# Patient Record
Sex: Female | Born: 1985 | Race: White | Hispanic: No | Marital: Single | Smoking: Current every day smoker
Health system: Southern US, Community
[De-identification: ages and names within clinical notes are randomized; demographics above are authoritative.]

## PROBLEM LIST (undated history)

## (undated) DIAGNOSIS — F1010 Alcohol abuse, uncomplicated: Secondary | ICD-10-CM

## (undated) DIAGNOSIS — R569 Unspecified convulsions: Secondary | ICD-10-CM

## (undated) DIAGNOSIS — G709 Myoneural disorder, unspecified: Secondary | ICD-10-CM

## (undated) DIAGNOSIS — K219 Gastro-esophageal reflux disease without esophagitis: Secondary | ICD-10-CM

## (undated) DIAGNOSIS — F1910 Other psychoactive substance abuse, uncomplicated: Secondary | ICD-10-CM

## (undated) HISTORY — PX: ANKLE FRACTURE SURGERY: SHX122

---

## 2010-07-17 HISTORY — PX: WRIST SURGERY: SHX841

## 2012-04-01 ENCOUNTER — Encounter (HOSPITAL_BASED_OUTPATIENT_CLINIC_OR_DEPARTMENT_OTHER): Payer: Self-pay | Admitting: *Deleted

## 2012-04-01 ENCOUNTER — Emergency Department (HOSPITAL_BASED_OUTPATIENT_CLINIC_OR_DEPARTMENT_OTHER)
Admission: EM | Admit: 2012-04-01 | Discharge: 2012-04-01 | Disposition: A | Discharge status: Home / Self Care | Payer: Medicaid Other | Attending: Emergency Medicine | Admitting: Emergency Medicine

## 2012-04-01 DIAGNOSIS — K0889 Other specified disorders of teeth and supporting structures: Secondary | ICD-10-CM

## 2012-04-01 DIAGNOSIS — K089 Disorder of teeth and supporting structures, unspecified: Secondary | ICD-10-CM | POA: Insufficient documentation

## 2012-04-01 DIAGNOSIS — F17200 Nicotine dependence, unspecified, uncomplicated: Secondary | ICD-10-CM | POA: Insufficient documentation

## 2012-04-01 DIAGNOSIS — Z888 Allergy status to other drugs, medicaments and biological substances status: Secondary | ICD-10-CM | POA: Insufficient documentation

## 2012-04-01 MED ORDER — OXYCODONE-ACETAMINOPHEN 5-325 MG PO TABS: ORAL_TABLET | ORAL | Status: DC

## 2012-04-01 MED ORDER — PENICILLIN V POTASSIUM 500 MG PO TABS: 500.0000 mg | ORAL_TABLET | Freq: Four times a day (QID) | ORAL | Status: AC

## 2012-04-01 MED ORDER — OXYCODONE-ACETAMINOPHEN 5-325 MG PO TABS
2.0000 | ORAL_TABLET | Freq: Once | ORAL | Status: AC
  Administered 2012-04-01: 2 via ORAL
  Filled 2012-04-01 (×2): qty 2

## 2012-04-01 NOTE — ED Notes (Signed)
Toothache x 3 days

## 2012-04-01 NOTE — ED Provider Notes (Signed)
History     CSN: [id number]  Arrival date & time 04/01/12  1200   First MD Initiated Contact with Patient 04/01/12 1307      Chief Complaint  Patient presents with  . Dental Problem    (Consider location/radiation/quality/duration/timing/severity/associated sxs/prior treatment) HPI  26 y.o. female in no acute distress complaining of pain to lower left jaw 3 days. Patient denies any fever, nausea vomiting. Does say she was instructed by her dentist to come to the emergency room for antibiotics. Pain is 10 out of 10 nonradiating, exacerbated by movement and showing.  History reviewed. No pertinent past medical history.  History reviewed. No pertinent past surgical history.  No family history on file.  History  Substance Use Topics  . Smoking status: Current Every Day Smoker -- 0.5 packs/day  . Smokeless tobacco: Not on file  . Alcohol Use: No    OB History    Grav Para Term Preterm Abortions TAB SAB Ect Mult Living                  Review of Systems  Constitutional: Negative for fever.  HENT: Positive for dental problem.   Respiratory: Negative for shortness of breath.   Cardiovascular: Negative for chest pain.  Gastrointestinal: Negative for nausea, vomiting, abdominal pain and diarrhea.  All other systems reviewed and are negative.    Allergies  Ultram  Home Medications   Current Outpatient Rx  Name Route Sig Dispense Refill  . OXYCODONE-ACETAMINOPHEN 5-325 MG PO TABS  1 to 2 tabs PO q6hrs  PRN for pain 10 tablet 0  . PENICILLIN V POTASSIUM 500 MG PO TABS Oral Take 1 tablet (500 mg total) by mouth 4 (four) times daily. 40 tablet 0    BP 120/56  Pulse 85  Temp 98.1 F (36.7 C) (Oral)  Resp 20  SpO2 100%  Physical Exam  Nursing note and vitals reviewed. Constitutional: She is oriented to person, place, and time. She appears well-developed and well-nourished. No distress.  HENT:  Head: Normocephalic.  Mouth/Throat: Oropharynx is clear and moist.      Dental caries, no signs of abscess  Eyes: Conjunctivae normal and EOM are normal.  Cardiovascular: Normal rate.   Pulmonary/Chest: Effort normal. No stridor.  Musculoskeletal: Normal range of motion.  Lymphadenopathy:    She has no cervical adenopathy.  Neurological: She is alert and oriented to person, place, and time.  Psychiatric: She has a normal mood and affect.    ED Course  Procedures (including critical care time)  Labs Reviewed - No data to display No results found.   1. Pain, dental       MDM  Patient states she has a dentist that she can followup with. I will give her pain control and potassium VK for infection prophylaxis. Pt verbalized understanding and agrees with care plan. Outpatient follow-up and return precautions given.           Wynetta Emery, PA-C 04/01/12 1545

## 2012-04-02 NOTE — ED Provider Notes (Signed)
Medical screening examination/treatment/procedure(s) were performed by non-physician practitioner and as supervising physician I was immediately available for consultation/collaboration.  Geoffery Lyons, MD 04/02/12 [phone]

## 2012-04-04 ENCOUNTER — Encounter (HOSPITAL_COMMUNITY): Payer: Self-pay | Admitting: Emergency Medicine

## 2012-04-04 ENCOUNTER — Emergency Department (HOSPITAL_COMMUNITY)
Admission: EM | Admit: 2012-04-04 | Discharge: 2012-04-04 | Disposition: A | Discharge status: Home / Self Care | Payer: Medicaid Other | Attending: Emergency Medicine | Admitting: Emergency Medicine

## 2012-04-04 DIAGNOSIS — R6884 Jaw pain: Secondary | ICD-10-CM | POA: Insufficient documentation

## 2012-04-04 DIAGNOSIS — K089 Disorder of teeth and supporting structures, unspecified: Secondary | ICD-10-CM | POA: Insufficient documentation

## 2012-04-04 DIAGNOSIS — G8929 Other chronic pain: Secondary | ICD-10-CM

## 2012-04-04 MED ORDER — BUPIVACAINE HCL (PF) 0.25 % IJ SOLN: 5.0000 mL | Freq: Once | INTRAMUSCULAR | Status: DC

## 2012-04-04 MED ORDER — PENICILLIN V POTASSIUM 500 MG PO TABS: 1000.0000 mg | ORAL_TABLET | Freq: Two times a day (BID) | ORAL | Status: DC

## 2012-04-04 MED ORDER — BUPIVACAINE-EPINEPHRINE PF 0.5-1:200000 % IJ SOLN
INTRAMUSCULAR | Status: AC
  Administered 2012-04-04: 14:00:00
  Filled 2012-04-04: qty 1.8

## 2012-04-04 MED ORDER — OXYCODONE-ACETAMINOPHEN 5-325 MG PO TABS: ORAL_TABLET | ORAL | Status: DC

## 2012-04-04 NOTE — ED Provider Notes (Signed)
Medical screening examination/treatment/procedure(s) were performed by non-physician practitioner and as supervising physician I was immediately available for consultation/collaboration.   Gwyneth Sprout, MD 04/04/12 [phone]

## 2012-04-04 NOTE — ED Provider Notes (Signed)
History     CSN: [id number]  Arrival date & time 04/04/12  1206   First MD Initiated Contact with Patient 04/04/12 1301      Chief Complaint  Patient presents with  . Dental Pain    l/lower jaw pain x 2 days    (Consider location/radiation/quality/duration/timing/severity/associated sxs/prior treatment) HPI  26 y.o. female-year-old female in no acute distress complaining of exacerbation of chronic dental pain to left lower jaw. Patient denies any fever, nausea vomiting, change in vision. Patient states that her dentist (Dr. Clarene Duke) sent her to the emergency room.   History reviewed. No pertinent past medical history.  Past Surgical History  Procedure Date  . Ankle fracture surgery     History reviewed. No pertinent family history.  History  Substance Use Topics  . Smoking status: Current Every Day Smoker    Types: Cigarettes  . Smokeless tobacco: Not on file  . Alcohol Use: No    OB History    Grav Para Term Preterm Abortions TAB SAB Ect Mult Living                  Review of Systems  Constitutional: Negative for fever.  HENT: Positive for dental problem.   Respiratory: Negative for shortness of breath.   Cardiovascular: Negative for chest pain.  Gastrointestinal: Negative for nausea, vomiting, abdominal pain and diarrhea.  All other systems reviewed and are negative.    Allergies  Tramadol  Home Medications   Current Outpatient Rx  Name Route Sig Dispense Refill  . ACETAMINOPHEN 325 MG PO TABS Oral Take 650 mg by mouth every 6 (six) hours as needed. Pain    . OXYCODONE-ACETAMINOPHEN 5-325 MG PO TABS  1 to 2 tabs PO q6hrs  PRN for pain 15 tablet 0  . PENICILLIN V POTASSIUM 500 MG PO TABS Oral Take 2 tablets (1,000 mg total) by mouth 2 (two) times daily. X 7 days 28 tablet 0    BP 123/71  Pulse 89  Temp 98.1 F (36.7 C) (Oral)  Resp 18  Wt 145 lb (65.772 kg)  SpO2 100%  LMP 03/15/2012  Physical Exam  Nursing note and vitals  reviewed. Constitutional: She is oriented to person, place, and time. She appears well-developed and well-nourished. No distress.  HENT:  Head: Normocephalic and atraumatic.  Right Ear: External ear normal.       Multiple dental caries  Eyes: Conjunctivae normal and EOM are normal. Pupils are equal, round, and reactive to light.  Neck: Normal range of motion.  Cardiovascular: Normal rate.   Pulmonary/Chest: Effort normal and breath sounds normal. No stridor.  Abdominal: Soft. Bowel sounds are normal.  Musculoskeletal: Normal range of motion.  Neurological: She is alert and oriented to person, place, and time.  Psychiatric: She has a normal mood and affect.    ED Course  Procedures (including critical care time)  Labs Reviewed - No data to display No results found.   1. Chronic dental pain       MDM  26 y.o. female with chronic dental pain given dental block with significant relief. Patient has a dentist Dr. Clarene Duke who she will follow with.   New Prescriptions   OXYCODONE-ACETAMINOPHEN (PERCOCET/ROXICET) 5-325 MG PER TABLET    1 to 2 tabs PO q6hrs  PRN for pain   PENICILLIN V POTASSIUM (VEETID) 500 MG TABLET    Take 2 tablets (1,000 mg total) by mouth 2 (two) times daily. X 7 days  Wynetta Emery, PA-C 04/04/12 1415

## 2012-04-04 NOTE — ED Notes (Signed)
Pt reports l/lower jaw pain x 2 days. Instructed by dentist to be evaluated in ED for pain and obtain antibiotics

## 2012-04-04 NOTE — Progress Notes (Signed)
wl ed cm noted pt without pcp self pay with tooth pain CM provided pt information on free dental clinic on 04/12/12 & 04/13/12 at [employer].  Pt appreciative of resources Fast track rn updated Provided pt with list of accepting self pay providers, financial and medication resources for [employer]

## 2012-04-05 ENCOUNTER — Encounter (HOSPITAL_BASED_OUTPATIENT_CLINIC_OR_DEPARTMENT_OTHER): Payer: Self-pay | Admitting: *Deleted

## 2012-04-26 ENCOUNTER — Encounter (HOSPITAL_COMMUNITY): Payer: Self-pay | Admitting: *Deleted

## 2012-04-26 ENCOUNTER — Emergency Department (HOSPITAL_COMMUNITY)
Admission: EM | Admit: 2012-04-26 | Discharge: 2012-04-26 | Disposition: A | Discharge status: Home / Self Care | Payer: Self-pay | Attending: Emergency Medicine | Admitting: Emergency Medicine

## 2012-04-26 DIAGNOSIS — K089 Disorder of teeth and supporting structures, unspecified: Secondary | ICD-10-CM | POA: Insufficient documentation

## 2012-04-26 DIAGNOSIS — K0889 Other specified disorders of teeth and supporting structures: Secondary | ICD-10-CM

## 2012-04-26 NOTE — ED Notes (Signed)
Pt continues to wait to be evaluated by ED provider. 

## 2012-04-26 NOTE — ED Notes (Signed)
Pt resting quietly in room at this time.  Continues to wait to be evaluated by ED provider.

## 2012-04-26 NOTE — ED Provider Notes (Signed)
Karen Cline is a 26 y.o. female walk to the dentist to state that she did not want to wait any longer. She has mild swelling right jaw region. She is ambulating easily. Her vital signs are normal. She was encouraged to stay, but decided to leave.  Flint Melter, MD 04/26/12 [phone]

## 2012-04-26 NOTE — ED Notes (Signed)
Toothache since yesterday 

## 2012-04-28 ENCOUNTER — Emergency Department (HOSPITAL_BASED_OUTPATIENT_CLINIC_OR_DEPARTMENT_OTHER)
Admission: EM | Admit: 2012-04-28 | Discharge: 2012-04-28 | Disposition: A | Discharge status: Home / Self Care | Payer: Medicaid Other | Attending: Emergency Medicine | Admitting: Emergency Medicine

## 2012-04-28 ENCOUNTER — Encounter (HOSPITAL_BASED_OUTPATIENT_CLINIC_OR_DEPARTMENT_OTHER): Payer: Self-pay | Admitting: *Deleted

## 2012-04-28 DIAGNOSIS — K089 Disorder of teeth and supporting structures, unspecified: Secondary | ICD-10-CM | POA: Insufficient documentation

## 2012-04-28 DIAGNOSIS — F17200 Nicotine dependence, unspecified, uncomplicated: Secondary | ICD-10-CM | POA: Insufficient documentation

## 2012-04-28 DIAGNOSIS — K0889 Other specified disorders of teeth and supporting structures: Secondary | ICD-10-CM

## 2012-04-28 MED ORDER — ETODOLAC 500 MG PO TABS: 500.0000 mg | ORAL_TABLET | Freq: Two times a day (BID) | ORAL | Status: DC

## 2012-04-28 MED ORDER — PENICILLIN V POTASSIUM 500 MG PO TABS: 500.0000 mg | ORAL_TABLET | Freq: Three times a day (TID) | ORAL | Status: DC

## 2012-04-28 MED ORDER — OXYCODONE-ACETAMINOPHEN 5-325 MG PO TABS
1.0000 | ORAL_TABLET | Freq: Once | ORAL | Status: AC
  Administered 2012-04-28: 1 via ORAL
  Filled 2012-04-28 (×2): qty 1
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
## 2012-04-28 NOTE — ED Provider Notes (Signed)
History     CSN: [id number]  Arrival date & time 04/28/12  1305   First MD Initiated Contact with Patient 04/28/12 1409      Chief Complaint  Patient presents with  . Dental Pain   Patient is a 26 y.o. female presenting with tooth pain. The history is provided by the patient.  Dental PainThe symptoms began 3 to 5 days ago. The symptoms are unchanged. The symptoms are recurrent. The symptoms occur constantly.  Additional symptoms include: dental sensitivity to temperature, gum tenderness and jaw pain. Additional symptoms do not include: gum swelling, purulent gums, trismus, trouble swallowing, drooling and ear pain.   the patient states she saw her dentist but was told she needs to be on antibiotics patient states her dentist did not prescribe any for her.  She has plans to followup with her dentist.  Patient was seen in emergency department last month for dental pain however the patient states that was a different issue  History reviewed. No pertinent past medical history.  History reviewed. No pertinent past surgical history.  History reviewed. No pertinent family history.  History  Substance Use Topics  . Smoking status: Current Every Day Smoker -- 0.5 packs/day    Types: Cigarettes  . Smokeless tobacco: Not on file  . Alcohol Use: No    OB History    Grav Para Term Preterm Abortions TAB SAB Ect Mult Living                  Review of Systems  HENT: Negative for ear pain, drooling and trouble swallowing.   All other systems reviewed and are negative.    Allergies  Tramadol and Ultram  Home Medications   Current Outpatient Rx  Name Route Sig Dispense Refill  . ETODOLAC 500 MG PO TABS Oral Take 1 tablet (500 mg total) by mouth 2 (two) times daily. 20 tablet 0  . PENICILLIN V POTASSIUM 500 MG PO TABS Oral Take 1 tablet (500 mg total) by mouth 3 (three) times daily. 30 tablet 0    BP 112/73  Pulse 94  Temp 98.7 F (37.1 C) (Oral)  Resp 18  Ht 5\' 4"  (1.626 m)   Wt 138 lb (62.596 kg)  BMI 23.69 kg/m2  SpO2 100%  LMP 04/21/2012  Physical Exam  Nursing note and vitals reviewed. Constitutional: She appears well-developed and well-nourished. No distress.  HENT:  Head: Normocephalic and atraumatic.  Right Ear: External ear normal.  Left Ear: External ear normal.       No jaw swelling noted, patient does have tenderness in the left lower mandibular area on one of her molars  Eyes: Conjunctivae normal are normal. Right eye exhibits no discharge. Left eye exhibits no discharge. No scleral icterus.  Neck: Neck supple. No tracheal deviation present.  Cardiovascular: Normal rate.   Pulmonary/Chest: Effort normal. No stridor. No respiratory distress.  Musculoskeletal: She exhibits no edema.  Neurological: She is alert. Cranial nerve deficit: no gross deficits.  Skin: Skin is warm and dry. No rash noted.  Psychiatric: She has a normal mood and affect.    ED Course  Procedures (including critical care time)  Labs Reviewed - No data to display No results found.   1. Pain, dental       MDM  Patient was given a dose of Percocet here in the emergency department. I prescribed Lodine and penicillin. I encouraged followup with her dentist. there is no evidence of any obvious abscess  At this  time there does not appear to be any evidence of an acute emergency medical condition and the patient appears stable for discharge with appropriate outpatient follow up.         Celene Kras, MD 04/28/12 [phone]

## 2012-06-17 ENCOUNTER — Encounter (HOSPITAL_COMMUNITY): Payer: Self-pay | Admitting: Emergency Medicine

## 2012-10-04 ENCOUNTER — Encounter (HOSPITAL_COMMUNITY): Payer: Self-pay | Admitting: Adult Health

## 2012-10-04 ENCOUNTER — Emergency Department (HOSPITAL_COMMUNITY)
Admission: EM | Admit: 2012-10-04 | Discharge: 2012-10-05 | Disposition: A | Discharge status: Home / Self Care | Payer: Medicaid Other | Attending: Emergency Medicine | Admitting: Emergency Medicine

## 2012-10-04 DIAGNOSIS — Z3202 Encounter for pregnancy test, result negative: Secondary | ICD-10-CM | POA: Insufficient documentation

## 2012-10-04 DIAGNOSIS — R569 Unspecified convulsions: Secondary | ICD-10-CM | POA: Insufficient documentation

## 2012-10-04 DIAGNOSIS — F1910 Other psychoactive substance abuse, uncomplicated: Secondary | ICD-10-CM | POA: Insufficient documentation

## 2012-10-04 DIAGNOSIS — F17200 Nicotine dependence, unspecified, uncomplicated: Secondary | ICD-10-CM | POA: Insufficient documentation

## 2012-10-04 HISTORY — DX: Other psychoactive substance abuse, uncomplicated: F19.10

## 2012-10-04 HISTORY — DX: Alcohol abuse, uncomplicated: F10.10

## 2012-10-04 LAB — CBC
Platelets: 254 10*3/uL (ref 150–400)
RDW: 14 % (ref 11.5–15.5)
WBC: 5 10*3/uL (ref 4.0–10.5)

## 2012-10-04 LAB — RAPID URINE DRUG SCREEN, HOSP PERFORMED
Amphetamines: NOT DETECTED
Barbiturates: NOT DETECTED
Benzodiazepines: POSITIVE — AB
Cocaine: POSITIVE — AB

## 2012-10-04 LAB — COMPREHENSIVE METABOLIC PANEL
AST: 16 U/L (ref 0–37)
Albumin: 3.8 g/dL (ref 3.5–5.2)
Alkaline Phosphatase: 87 U/L (ref 39–117)
Chloride: 102 mEq/L (ref 96–112)
Potassium: 3.7 mEq/L (ref 3.5–5.1)
Total Bilirubin: 0.4 mg/dL (ref 0.3–1.2)
Total Protein: 6.9 g/dL (ref 6.0–8.3)

## 2012-10-04 LAB — SALICYLATE LEVEL: Salicylate Lvl: <2 mg/dL — ABNORMAL LOW (ref 2.8–20.0)

## 2012-10-04 LAB — POCT PREGNANCY, URINE: Preg Test, Ur: NEGATIVE

## 2012-10-04 LAB — ACETAMINOPHEN LEVEL: Acetaminophen (Tylenol), Serum: <15 ug/mL (ref 10–30)

## 2012-10-04 MED ORDER — ACETAMINOPHEN 325 MG PO TABS
650.0000 mg | ORAL_TABLET | ORAL | Status: DC | PRN
  Administered 2012-10-04: 650 mg via ORAL
  Filled 2012-10-04: qty 2

## 2012-10-04 MED ORDER — ONDANSETRON HCL 8 MG PO TABS
4.0000 mg | ORAL_TABLET | Freq: Three times a day (TID) | ORAL | Status: DC | PRN
  Administered 2012-10-05: 4 mg via ORAL
  Filled 2012-10-04: qty 1

## 2012-10-04 MED ORDER — NICOTINE 21 MG/24HR TD PT24
21.0000 mg | MEDICATED_PATCH | Freq: Every day | TRANSDERMAL | Status: DC
  Administered 2012-10-04: 21 mg via TRANSDERMAL
  Filled 2012-10-04: qty 1

## 2012-10-04 MED ORDER — ZOLPIDEM TARTRATE 5 MG PO TABS: 5.0000 mg | ORAL_TABLET | Freq: Every evening | ORAL | Status: DC | PRN

## 2012-10-04 NOTE — ED Notes (Addendum)
Pt reports taking 8 mg of Xanax per day for 5 months everyday and she has not had any for 24 hours. States, "I have diarhhea, no appetite, no sleep,  Leg cramps, nausea, my fingers and hands are numb" requesting detox. Denies other drug use, denies alcohol use. She denies Rx for Xanax, states she has been buying them.

## 2012-10-04 NOTE — ED Notes (Signed)
Pt. Reports here to detox from benzos, states takes 5-6mg  PO daily. Reporting lightheadedness, decreased appetite, and diarrhea. Pt. Denies SI/HI. Reports hx of seizures in past from trying to detox. Placed on seizure precautions.

## 2012-10-04 NOTE — ED Notes (Signed)
Pt. wanded by security, belongings inventoried and locked
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
## 2012-10-05 ENCOUNTER — Encounter (HOSPITAL_COMMUNITY): Payer: Self-pay | Admitting: *Deleted

## 2012-10-05 MED ORDER — CLONIDINE HCL 0.1 MG PO TABS
0.1000 mg | ORAL_TABLET | Freq: Two times a day (BID) | ORAL | Status: DC | PRN
  Administered 2012-10-05: 0.1 mg via ORAL
  Filled 2012-10-05: qty 1

## 2012-10-05 MED ORDER — CLONIDINE HCL 0.2 MG PO TABS: 0.1000 mg | ORAL_TABLET | Freq: Two times a day (BID) | ORAL | Status: DC

## 2012-10-05 MED ORDER — GI COCKTAIL ~~LOC~~
30.0000 mL | Freq: Once | ORAL | Status: AC
  Administered 2012-10-05: 30 mL via ORAL
  Filled 2012-10-05 (×2): qty 30

## 2012-10-05 NOTE — ED Provider Notes (Signed)
Medical screening examination/treatment/procedure(s) were performed by non-physician practitioner and as supervising physician I was immediately available for consultation/collaboration.  Sunnie Nielsen, MD 10/05/12 [phone]

## 2012-10-05 NOTE — ED Provider Notes (Addendum)
Pt with symptoms currently of opiate withdrawal with leg cramps, abdominal cramping, nausea and chills. She is otherwise well-appearing is not currently vomiting.  Patient given clonidine and a GI cocktail.  1:30 PM Pt requesting to leave.  Given clonidine for detox from heroin and given outpt resources.  Gwyneth Sprout, MD 10/05/12 0981  Gwyneth Sprout, MD 10/05/12 1331
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
## 2012-10-05 NOTE — ED Provider Notes (Signed)
History     CSN: [id number]  Arrival date & time 10/04/12  2025   First MD Initiated Contact with Patient 10/04/12 2136      Chief Complaint  Patient presents with  . Withdrawal    (Consider location/radiation/quality/duration/timing/severity/associated sxs/prior treatment) Patient is a 27 y.o. female presenting with drug problem. The history is provided by the patient. No language interpreter was used.  Drug Problem This is a chronic problem. Pertinent negatives include no abdominal pain, chest pain, fever or headaches. Associated symptoms comments: She reports seizures as a result of decreasing her doses of Xanax that she has been taking in large quantities for the last 5 months. Last use 2 hours PTA.Marland Kitchen    History reviewed. No pertinent past medical history.  Past Surgical History  Procedure Laterality Date  . Ankle fracture surgery      history restored after error with record merge        History reviewed. No pertinent family history.  History  Substance Use Topics  . Smoking status: Current Every Day Smoker -- 0.50 packs/day    Types: Cigarettes  . Smokeless tobacco: Not on file  . Alcohol Use: No    OB History   Grav Para Term Preterm Abortions TAB SAB Ect Mult Living                  Review of Systems  Constitutional: Negative for fever.  Respiratory: Negative for shortness of breath.   Cardiovascular: Negative for chest pain.  Gastrointestinal: Negative for abdominal pain.  Neurological: Negative for headaches.  Psychiatric/Behavioral: Negative for suicidal ideas and confusion.    Allergies  Tramadol and Ultram  Home Medications  No current outpatient prescriptions on file.  BP 112/61  Pulse 93  Temp(Src) 97.6 F (36.4 C) (Oral)  Resp 18  SpO2 97%  Physical Exam  Constitutional: She is oriented to person, place, and time. She appears well-developed and well-nourished.  HENT:  Head: Normocephalic.  Neck: Normal range of motion. Neck supple.   Cardiovascular: Normal rate and regular rhythm.   Pulmonary/Chest: Effort normal and breath sounds normal. She has no wheezes. She has no rales.  Abdominal: Soft. Bowel sounds are normal. There is no tenderness. There is no rebound and no guarding.  Musculoskeletal: Normal range of motion.  Neurological: She is alert and oriented to person, place, and time.  Skin: Skin is warm and dry. No rash noted.  Psychiatric: She has a normal mood and affect.    ED Course  Procedures (including critical care time)  Labs Reviewed  CBC - Abnormal; Notable for the following:    Hemoglobin 11.9 (*)    HCT 35.9 (*)    All other components within normal limits  COMPREHENSIVE METABOLIC PANEL - Abnormal; Notable for the following:    Glucose, Bld 108 (*)    All other components within normal limits  SALICYLATE LEVEL - Abnormal; Notable for the following:    Salicylate Lvl <2.0 (*)    All other components within normal limits  URINE RAPID DRUG SCREEN (HOSP PERFORMED) - Abnormal; Notable for the following:    Opiates POSITIVE (*)    Cocaine POSITIVE (*)    Benzodiazepines POSITIVE (*)    Tetrahydrocannabinol POSITIVE (*)    All other components within normal limits  ACETAMINOPHEN LEVEL  ETHANOL  POCT PREGNANCY, URINE   No results found.   No diagnosis found.    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 Will monitor. [facility] notified  of patient's need for assessment.         Arnoldo Hooker, PA-C 10/05/12 0502

## 2012-10-05 NOTE — B[facility] Counselor (Signed)
Writer was just informed by Nurse Sherrie George that the pt presented to the nurses' station and stated "I think I'll go ahead and leave". Pt nurse will inform Dr. Tanna Savoy of the pt's wish to leave. Denice Bors, AADC 10/05/2012 1:34 PM

## 2014-03-20 ENCOUNTER — Encounter (HOSPITAL_BASED_OUTPATIENT_CLINIC_OR_DEPARTMENT_OTHER): Payer: Self-pay | Admitting: Emergency Medicine

## 2014-03-20 ENCOUNTER — Emergency Department (HOSPITAL_BASED_OUTPATIENT_CLINIC_OR_DEPARTMENT_OTHER)
Admission: EM | Admit: 2014-03-20 | Discharge: 2014-03-21 | Disposition: A | Discharge status: Other Acute Inpatient Hospital | Payer: Medicaid Other | Attending: Emergency Medicine | Admitting: Emergency Medicine

## 2014-03-20 DIAGNOSIS — F1920 Other psychoactive substance dependence, uncomplicated: Secondary | ICD-10-CM

## 2014-03-20 DIAGNOSIS — F1210 Cannabis abuse, uncomplicated: Secondary | ICD-10-CM | POA: Insufficient documentation

## 2014-03-20 DIAGNOSIS — F17200 Nicotine dependence, unspecified, uncomplicated: Secondary | ICD-10-CM | POA: Insufficient documentation

## 2014-03-20 DIAGNOSIS — Z79899 Other long term (current) drug therapy: Secondary | ICD-10-CM | POA: Insufficient documentation

## 2014-03-20 DIAGNOSIS — Z3202 Encounter for pregnancy test, result negative: Secondary | ICD-10-CM | POA: Insufficient documentation

## 2014-03-20 DIAGNOSIS — F1410 Cocaine abuse, uncomplicated: Secondary | ICD-10-CM | POA: Insufficient documentation

## 2014-03-20 DIAGNOSIS — F1120 Opioid dependence, uncomplicated: Secondary | ICD-10-CM | POA: Insufficient documentation

## 2014-03-20 DIAGNOSIS — Z008 Encounter for other general examination: Secondary | ICD-10-CM | POA: Insufficient documentation

## 2014-03-20 MED ORDER — CLONIDINE HCL 0.1 MG PO TABS: 0.1000 mg | ORAL_TABLET | ORAL | Status: DC

## 2014-03-20 MED ORDER — CLONIDINE HCL 0.1 MG PO TABS: 0.1000 mg | ORAL_TABLET | Freq: Four times a day (QID) | ORAL | Status: DC

## 2014-03-20 MED ORDER — CLONIDINE HCL 0.1 MG PO TABS: 0.1000 mg | ORAL_TABLET | Freq: Every day | ORAL | Status: DC

## 2014-03-20 MED ORDER — ONDANSETRON 4 MG PO TBDP
4.0000 mg | ORAL_TABLET | Freq: Four times a day (QID) | ORAL | Status: DC | PRN
  Administered 2014-03-21: 4 mg via ORAL
  Filled 2014-03-20: qty 1

## 2014-03-20 MED ORDER — LOPERAMIDE HCL 2 MG PO CAPS: 2.0000 mg | ORAL_CAPSULE | ORAL | Status: DC | PRN

## 2014-03-20 NOTE — ED Notes (Signed)
Patient repots that she needs detox from heroine and cocaine as well as benzo's and dilaudid. Patient has slurred spech in triage. Reports that last usage for cocaine this am

## 2014-03-20 NOTE — ED Provider Notes (Addendum)
CSN: [id number]     Arrival date & time 03/20/14  2255 History  This chart was scribed for Hanley Seamen, MD by Carl Best, ED Scribe. This patient was seen in room MH08/MH08 and the patient's care was started at 11:50 PM.     Chief Complaint  Patient presents with  . Addiction Problem    The history is provided by the patient. No language interpreter was used.   HPI Comments: Karen Cline is a 28 y.o. female who presents to the Emergency Department wanting to detox from heroin, cocaine, Xanax, Clonopin, and dilaudid.  She has been using heroin for the past 2 years. She does not use alcohol heavily. Her addiction is severe enough that she has overdosed three times in the past month requiring Narcan. She experiences withdrawal symptoms in the morning and has to use drugs at least three times a day to avoid experiencing withdrawal symptoms. She tried a Methadone clinic 2 years unsuccesfully.  She denies SI and HI as associated symptoms. She is out of her gabapentin and Celexa because she missed her last appointment at [facility].   Past Medical History  Diagnosis Date  . Alcohol abuse   . Polysubstance abuse     Xanax, Heroin   Past Surgical History  Procedure Laterality Date  . Ankle fracture surgery      history restored after error with record merge       History reviewed. No pertinent family history. History  Substance Use Topics  . Smoking status: Current Every Day Smoker -- 0.50 packs/day    Types: Cigarettes  . Smokeless tobacco: Not on file  . Alcohol Use: No   OB History   Grav Para Term Preterm Abortions TAB SAB Ect Mult Living                 Review of Systems  Psychiatric/Behavioral: Negative for suicidal ideas.  All other systems reviewed and are negative.   Allergies  Tramadol and Ultram  Home Medications   Prior to Admission medications   Medication Sig Start Date End Date Taking? Authorizing Provider  cloNIDine (CATAPRES) 0.2 MG tablet Take 0.5  tablets (0.1 mg total) by mouth 2 (two) times daily. 10/05/12   Gwyneth Sprout, MD   BP 119/67  Pulse 72  Temp(Src) 98.2 F (36.8 C) (Oral)  Resp 18  Ht  (1.626 m)  Wt 156 lb (70.761 kg)  BMI 26.76 kg/m2  SpO2 100%  LMP 03/03/2014 Physical Exam  Nursing note and vitals reviewed. Constitutional: She is oriented to person, place, and time. She appears well-developed and well-nourished.  HENT:  Head: Normocephalic and atraumatic.  Mouth/Throat: Oropharynx is clear and moist. No oropharyngeal exudate.  Eyes: Conjunctivae and EOM are normal. Pupils are equal, round, and reactive to light.  Neck: Normal range of motion.  Cardiovascular: Normal rate, regular rhythm and normal heart sounds.  Exam reveals no gallop and no friction rub.   No murmur heard. Pulmonary/Chest: Effort normal and breath sounds normal. No respiratory distress. She has no wheezes. She has no rales.  Abdominal: Soft. Bowel sounds are normal. There is no tenderness.  Musculoskeletal: Normal range of motion.  Neurological: She is alert and oriented to person, place, and time.  Skin: Skin is warm and dry.  Psychiatric: She has a normal mood and affect. Her behavior is normal.    ED Course  Procedures (including critical care time)  DIAGNOSTIC STUDIES: Oxygen Saturation is 100% on room air, normal by  my interpretation.    COORDINATION OF CARE: 11:54 PM- Discussed trying to admit the patient to detox.  The patient agreed to the treatment plan.    MDM   Nursing notes and vitals signs, including pulse oximetry, reviewed.  Summary of this visit's results, reviewed by myself:  Labs:  Results for orders placed during the hospital encounter of 03/20/14 (from the past 24 hour(s))  ETHANOL     Status: None   Collection Time    03/20/14 11:55 PM      Result Value Ref Range   Alcohol, Ethyl (B) <11  0 - 11 mg/dL  COMPREHENSIVE METABOLIC PANEL     Status: Abnormal   Collection Time    03/20/14 11:55 PM       Result Value Ref Range   Sodium 143  137 - 147 mEq/L   Potassium 4.1  3.7 - 5.3 mEq/L   Chloride 104  96 - 112 mEq/L   CO2 29  19 - 32 mEq/L   Glucose, Bld 105 (*) 70 - 99 mg/dL   BUN 9  6 - 23 mg/dL   Creatinine, Ser 4.09  0.50 - 1.10 mg/dL   Calcium 9.8  8.4 - 81.1 mg/dL   Total Protein 7.7  6.0 - 8.3 g/dL   Albumin 3.6  3.5 - 5.2 g/dL   AST 27  0 - 37 U/L   ALT 71 (*) 0 - 35 U/L   Alkaline Phosphatase 122 (*) 39 - 117 U/L   Total Bilirubin 0.3  0.3 - 1.2 mg/dL   GFR calc non Af Amer >90  >90 mL/min   GFR calc Af Amer >90  >90 mL/min   Anion gap 10  5 - 15  CBC WITH DIFFERENTIAL     Status: Abnormal   Collection Time    03/20/14 11:55 PM      Result Value Ref Range   WBC 7.3  4.0 - 10.5 K/uL   RBC 4.05  3.87 - 5.11 MIL/uL   Hemoglobin 12.0  12.0 - 15.0 g/dL   HCT 91.4  78.2 - 95.6 %   MCV 92.1  78.0 - 100.0 fL   MCH 29.6  26.0 - 34.0 pg   MCHC 32.2  30.0 - 36.0 g/dL   RDW 21.3  08.6 - 57.8 %   Platelets 262  150 - 400 K/uL   Neutrophils Relative % 43  43 - 77 %   Lymphocytes Relative 47 (*) 12 - 46 %   Monocytes Relative 9  3 - 12 %   Eosinophils Relative 1  0 - 5 %   Basophils Relative 0  0 - 1 %   Neutro Abs 3.1  1.7 - 7.7 K/uL   Lymphs Abs 3.4  0.7 - 4.0 K/uL   Monocytes Absolute 0.7  0.1 - 1.0 K/uL   Eosinophils Absolute 0.1  0.0 - 0.7 K/uL   Basophils Absolute 0.0  0.0 - 0.1 K/uL   WBC Morphology ATYPICAL LYMPHOCYTES    URINE RAPID DRUG SCREEN (HOSP PERFORMED)     Status: Abnormal   Collection Time    03/21/14  2:51 AM      Result Value Ref Range   Opiates POSITIVE (*) NONE DETECTED   Cocaine POSITIVE (*) NONE DETECTED   Benzodiazepines NONE DETECTED  NONE DETECTED   Amphetamines NONE DETECTED  NONE DETECTED   Tetrahydrocannabinol POSITIVE (*) NONE DETECTED   Barbiturates NONE DETECTED  NONE DETECTED  PREGNANCY, URINE  Status: None   Collection Time    03/21/14  2:51 AM      Result Value Ref Range   Preg Test, Ur NEGATIVE  NEGATIVE   3:23  AM Patient has been assessed by [facility]. She is awaiting placement.  5:44 AM Accepted for transfer to [facility] at 9AM today.  I personally performed the services described in this documentation, which was scribed in my presence. The recorded information has been reviewed and is accurate.   Hanley Seamen, MD 03/21/14 1610  Hanley Seamen, MD 03/21/14 [phone]

## 2014-03-21 LAB — CBC WITH DIFFERENTIAL/PLATELET
BASOS PCT: 0 % (ref 0–1)
Basophils Absolute: 0 10*3/uL (ref 0.0–0.1)
EOS ABS: 0.1 10*3/uL (ref 0.0–0.7)
Eosinophils Relative: 1 % (ref 0–5)
HCT: 37.3 % (ref 36.0–46.0)
Hemoglobin: 12 g/dL (ref 12.0–15.0)
LYMPHS ABS: 3.4 10*3/uL (ref 0.7–4.0)
Lymphocytes Relative: 47 % — ABNORMAL HIGH (ref 12–46)
MCH: 29.6 pg (ref 26.0–34.0)
MCHC: 32.2 g/dL (ref 30.0–36.0)
MCV: 92.1 fL (ref 78.0–100.0)
MONO ABS: 0.7 10*3/uL (ref 0.1–1.0)
Monocytes Relative: 9 % (ref 3–12)
Neutro Abs: 3.1 10*3/uL (ref 1.7–7.7)
Neutrophils Relative %: 43 % (ref 43–77)
PLATELETS: 262 10*3/uL (ref 150–400)
RBC: 4.05 MIL/uL (ref 3.87–5.11)
RDW: 14.9 % (ref 11.5–15.5)
WBC: 7.3 10*3/uL (ref 4.0–10.5)

## 2014-03-21 LAB — RAPID URINE DRUG SCREEN, HOSP PERFORMED
Amphetamines: NOT DETECTED
BENZODIAZEPINES: NOT DETECTED
Barbiturates: NOT DETECTED
COCAINE: POSITIVE — AB
OPIATES: POSITIVE — AB
TETRAHYDROCANNABINOL: POSITIVE — AB

## 2014-03-21 LAB — COMPREHENSIVE METABOLIC PANEL
ALK PHOS: 122 U/L — AB (ref 39–117)
ALT: 71 U/L — AB (ref 0–35)
AST: 27 U/L (ref 0–37)
Albumin: 3.6 g/dL (ref 3.5–5.2)
Anion gap: 10 (ref 5–15)
BUN: 9 mg/dL (ref 6–23)
CALCIUM: 9.8 mg/dL (ref 8.4–10.5)
CO2: 29 mEq/L (ref 19–32)
Chloride: 104 mEq/L (ref 96–112)
Creatinine, Ser: 0.8 mg/dL (ref 0.50–1.10)
GFR calc Af Amer: >90 mL/min (ref >90)
Glucose, Bld: 105 mg/dL — ABNORMAL HIGH (ref 70–99)
Potassium: 4.1 mEq/L (ref 3.7–5.3)
Sodium: 143 mEq/L (ref 137–147)
Total Bilirubin: 0.3 mg/dL (ref 0.3–1.2)
Total Protein: 7.7 g/dL (ref 6.0–8.3)

## 2014-03-21 LAB — PREGNANCY, URINE: Preg Test, Ur: NEGATIVE

## 2014-03-21 LAB — ETHANOL

## 2014-03-21 MED ORDER — GABAPENTIN 800 MG PO TABS
800.0000 mg | ORAL_TABLET | Freq: Once | ORAL | Status: AC
  Filled 2014-03-21: qty 1

## 2014-03-21 MED ORDER — IBUPROFEN 400 MG PO TABS
400.0000 mg | ORAL_TABLET | Freq: Once | ORAL | Status: AC
  Administered 2014-03-21: 400 mg via ORAL
  Filled 2014-03-21: qty 1

## 2014-03-21 MED ORDER — GABAPENTIN 100 MG PO CAPS
ORAL_CAPSULE | ORAL | Status: AC
  Administered 2014-03-21: 200 mg
  Filled 2014-03-21: qty 2

## 2014-03-21 MED ORDER — GABAPENTIN 600 MG PO TABS
ORAL_TABLET | ORAL | Status: AC
  Administered 2014-03-21: 600 mg
  Filled 2014-03-21: qty 1

## 2014-03-21 MED ORDER — CITALOPRAM HYDROBROMIDE 40 MG PO TABS: 40.0000 mg | ORAL_TABLET | Freq: Every day | ORAL | Status: DC

## 2014-03-21 MED ORDER — GABAPENTIN 400 MG PO CAPS: 800.0000 mg | ORAL_CAPSULE | Freq: Three times a day (TID) | ORAL | Status: DC

## 2014-03-21 NOTE — ED Notes (Addendum)
Karen Cline transport [phone])  to arrange transportation to  [facility] [address] [place], [place] [address] Room [address]

## 2014-03-21 NOTE — BH Assessment (Signed)
Received call from [place] at [facility] in [place]. Pt has been accepted and bed will be available at 0900 today. Notified Dr. Paula Libra of acceptance.   Harlin Rain Ria Comment, [facility] Triage Specialist [phone]
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
## 2014-03-21 NOTE — ED Notes (Signed)
Patient has all belongings and transported to facility by [facility], called facility and verified placement

## 2014-03-21 NOTE — ED Notes (Signed)
Contacted residential treatment services to verify placement & inform them that her transport was on the way

## 2014-03-21 NOTE — ED Notes (Signed)
[facility] will accept pt at 9:00 am today.

## 2014-03-21 NOTE — BH Assessment (Signed)
Urine drug screen and pregnancy tests are currently not complete. TTS will continue to check lab results. Detox facilities will not consider Pt without this information.  Harlin Rain Ria Comment, [facility] Triage Specialist [phone]

## 2014-03-21 NOTE — BH Assessment (Signed)
Karen Cline, [facility] at [facility], confirmed adult unit is currently at capacity. Contacted the following facilities for placement:  BED AVAILABLE, FAXED CLINICAL INFORMATION: [facility], per [facility], per Claris Che  AT CAPACITY: [facility], per Theodoro Grist, [facility], [facility] Triage Specialist [phone]

## 2014-07-05 ENCOUNTER — Emergency Department (HOSPITAL_COMMUNITY)
Admission: EM | Admit: 2014-07-05 | Discharge: 2014-07-06 | Disposition: A | Discharge status: Other Acute Inpatient Hospital | Payer: Medicaid Other | Attending: Emergency Medicine | Admitting: Emergency Medicine

## 2014-07-05 ENCOUNTER — Encounter (HOSPITAL_COMMUNITY): Payer: Self-pay | Admitting: Emergency Medicine

## 2014-07-05 DIAGNOSIS — F1994 Other psychoactive substance use, unspecified with psychoactive substance-induced mood disorder: Secondary | ICD-10-CM

## 2014-07-05 DIAGNOSIS — F1120 Opioid dependence, uncomplicated: Secondary | ICD-10-CM | POA: Insufficient documentation

## 2014-07-05 DIAGNOSIS — F1010 Alcohol abuse, uncomplicated: Secondary | ICD-10-CM | POA: Insufficient documentation

## 2014-07-05 DIAGNOSIS — F419 Anxiety disorder, unspecified: Secondary | ICD-10-CM | POA: Insufficient documentation

## 2014-07-05 DIAGNOSIS — F1910 Other psychoactive substance abuse, uncomplicated: Secondary | ICD-10-CM

## 2014-07-05 DIAGNOSIS — Z79899 Other long term (current) drug therapy: Secondary | ICD-10-CM | POA: Insufficient documentation

## 2014-07-05 DIAGNOSIS — Z720 Tobacco use: Secondary | ICD-10-CM | POA: Insufficient documentation

## 2014-07-05 LAB — COMPREHENSIVE METABOLIC PANEL
ALBUMIN: 3.9 g/dL (ref 3.5–5.2)
ALK PHOS: 69 U/L (ref 39–117)
ALT: 20 U/L (ref 0–35)
AST: 21 U/L (ref 0–37)
Anion gap: 15 (ref 5–15)
BILIRUBIN TOTAL: 0.3 mg/dL (ref 0.3–1.2)
BUN: 7 mg/dL (ref 6–23)
CO2: 22 meq/L (ref 19–32)
CREATININE: 0.76 mg/dL (ref 0.50–1.10)
Calcium: 9.7 mg/dL (ref 8.4–10.5)
Chloride: 100 mEq/L (ref 96–112)
GFR calc Af Amer: >90 mL/min (ref >90)
Glucose, Bld: 70 mg/dL (ref 70–99)
POTASSIUM: 4.1 meq/L (ref 3.7–5.3)
Sodium: 137 mEq/L (ref 137–147)
Total Protein: 7.9 g/dL (ref 6.0–8.3)

## 2014-07-05 LAB — PREGNANCY, URINE: PREG TEST UR: NEGATIVE

## 2014-07-05 LAB — CBC
HEMATOCRIT: 39.7 % (ref 36.0–46.0)
Hemoglobin: 13 g/dL (ref 12.0–15.0)
MCH: 29.1 pg (ref 26.0–34.0)
MCHC: 32.7 g/dL (ref 30.0–36.0)
MCV: 89 fL (ref 78.0–100.0)
Platelets: 345 10*3/uL (ref 150–400)
RBC: 4.46 MIL/uL (ref 3.87–5.11)
RDW: 14.5 % (ref 11.5–15.5)
WBC: 6.8 10*3/uL (ref 4.0–10.5)

## 2014-07-05 LAB — RAPID URINE DRUG SCREEN, HOSP PERFORMED
Amphetamines: NOT DETECTED
BARBITURATES: NOT DETECTED
BENZODIAZEPINES: NOT DETECTED
Cocaine: POSITIVE — AB
Opiates: POSITIVE — AB
TETRAHYDROCANNABINOL: NOT DETECTED

## 2014-07-05 LAB — ACETAMINOPHEN LEVEL: Acetaminophen (Tylenol), Serum: <15 ug/mL (ref 10–30)

## 2014-07-05 LAB — SALICYLATE LEVEL: Salicylate Lvl: <2 mg/dL — ABNORMAL LOW (ref 2.8–20.0)

## 2014-07-05 LAB — ETHANOL: Alcohol, Ethyl (B): 16 mg/dL — ABNORMAL HIGH (ref 0–11)

## 2014-07-05 MED ORDER — IBUPROFEN 200 MG PO TABS
400.0000 mg | ORAL_TABLET | Freq: Four times a day (QID) | ORAL | Status: DC | PRN
  Administered 2014-07-05 – 2014-07-06 (×2): 400 mg via ORAL
  Filled 2014-07-05 (×2): qty 2

## 2014-07-05 MED ORDER — LORAZEPAM 1 MG PO TABS: 0.0000 mg | ORAL_TABLET | Freq: Two times a day (BID) | ORAL | Status: DC

## 2014-07-05 MED ORDER — CLONIDINE HCL 0.1 MG PO TABS
0.1000 mg | ORAL_TABLET | Freq: Two times a day (BID) | ORAL | Status: DC
  Filled 2014-07-05 (×2): qty 1

## 2014-07-05 MED ORDER — GABAPENTIN 400 MG PO CAPS
800.0000 mg | ORAL_CAPSULE | Freq: Three times a day (TID) | ORAL | Status: DC
  Administered 2014-07-05 – 2014-07-06 (×2): 800 mg via ORAL
  Filled 2014-07-05 (×2): qty 2

## 2014-07-05 MED ORDER — ONDANSETRON HCL 4 MG PO TABS
4.0000 mg | ORAL_TABLET | Freq: Three times a day (TID) | ORAL | Status: DC | PRN
  Administered 2014-07-05: 4 mg via ORAL
  Filled 2014-07-05: qty 1

## 2014-07-05 MED ORDER — CITALOPRAM HYDROBROMIDE 40 MG PO TABS
40.0000 mg | ORAL_TABLET | Freq: Every day | ORAL | Status: DC
  Administered 2014-07-06 (×2): 40 mg via ORAL
  Filled 2014-07-05 (×3): qty 1

## 2014-07-05 MED ORDER — LORAZEPAM 1 MG PO TABS
0.0000 mg | ORAL_TABLET | Freq: Four times a day (QID) | ORAL | Status: DC
  Administered 2014-07-05: 1 mg via ORAL
  Administered 2014-07-06: 2 mg via ORAL
  Filled 2014-07-05: qty 2
  Filled 2014-07-05 (×2): qty 1

## 2014-07-05 NOTE — ED Notes (Signed)
Pt states she is detoxing off of morphine, heroine, dilaudid, xanax, ETOH. Pt states she last drank ETOH this morning, last had morphine and xanax last night. Pt denies suicidal ideation, denies any thoughts of self-harm.

## 2014-07-05 NOTE — ED Provider Notes (Signed)
CSN: [id number]     Arrival date & time 07/05/14  1553 History   First MD Initiated Contact with Patient 07/05/14 1608     Chief Complaint  Patient presents with  . Detoxification      (Consider location/radiation/quality/duration/timing/severity/associated sxs/prior Treatment) HPI Comments: Patient presents to the ED with a chief complaint of polysubstance abuse.  She states that she uses morphine, heroine, dilaudid, xanax, and drinks a lot of ETOH.  She states that her last use of Xanax was last night.  Last ETOH was this morning.  She states that she realizes this is consuming her life.  She denies any SI or HI.  She would like to detox from all of the substances.  She states that she has felt agitated and has had some nausea and diarrhea.  The history is provided by the patient. No language interpreter was used.    Past Medical History  Diagnosis Date  . Alcohol abuse   . Polysubstance abuse     Xanax, Heroin   Past Surgical History  Procedure Laterality Date  . Ankle fracture surgery      history restored after error with record merge       History reviewed. No pertinent family history. History  Substance Use Topics  . Smoking status: Current Every Day Smoker -- 0.50 packs/day    Types: Cigarettes  . Smokeless tobacco: Not on file  . Alcohol Use: Yes     Comment: ETOH abuse   OB History    No data available     Review of Systems  Constitutional: Negative for fever and chills.  Respiratory: Negative for shortness of breath.   Cardiovascular: Negative for chest pain.  Gastrointestinal: Negative for nausea, vomiting, diarrhea and constipation.  Genitourinary: Negative for dysuria.  Psychiatric/Behavioral: The patient is nervous/anxious.   All other systems reviewed and are negative.     Allergies  Tramadol and Ultram  Home Medications   Prior to Admission medications   Medication Sig Start Date End Date Taking? Authorizing Provider  citalopram (CELEXA) 40  MG tablet Take 40 mg by mouth daily.    Historical Provider, MD  citalopram (CELEXA) 40 MG tablet Take 1 tablet (40 mg total) by mouth daily. 03/21/14   Carlisle BeersJohn L Molpus, MD  cloNIDine (CATAPRES) 0.2 MG tablet Take 0.5 tablets (0.1 mg total) by mouth 2 (two) times daily. 10/05/12   Gwyneth SproutWhitney Plunkett, MD  gabapentin (NEURONTIN) 400 MG capsule Take 2 capsules (800 mg total) by mouth 3 (three) times daily. 03/21/14   John L Molpus, MD  gabapentin (NEURONTIN) 800 MG tablet Take 800 mg by mouth 3 (three) times daily.    Historical Provider, MD   BP 123/82 mmHg  Pulse 66  Temp(Src) 97.5 F (36.4 C) (Oral)  Resp 16  SpO2 100%  LMP 07/05/2014 Physical Exam  Constitutional: She is oriented to person, place, and time. She appears well-developed and well-nourished.  HENT:  Head: Normocephalic and atraumatic.  Eyes: Conjunctivae and EOM are normal. Pupils are equal, round, and reactive to light.  Neck: Normal range of motion. Neck supple.  Cardiovascular: Normal rate and regular rhythm.  Exam reveals no gallop and no friction rub.   No murmur heard. Pulmonary/Chest: Effort normal and breath sounds normal. No respiratory distress. She has no wheezes. She has no rales. She exhibits no tenderness.  Abdominal: Soft. Bowel sounds are normal. She exhibits no distension and no mass. There is no tenderness. There is no rebound and no guarding.  No focal abdominal tenderness, no RLQ tenderness or pain at McBurney's point, no RUQ tenderness or Murphy's sign, no left-sided abdominal tenderness, no fluid wave, or signs of peritonitis   Musculoskeletal: Normal range of motion. She exhibits no edema or tenderness.  Neurological: She is alert and oriented to person, place, and time.  Skin: Skin is warm and dry.  Psychiatric: She has a normal mood and affect. Her behavior is normal. Judgment and thought content normal.  Nursing note and vitals reviewed.   ED Course  Procedures (including critical care time) Results for  orders placed or performed during the hospital encounter of 07/05/14  Acetaminophen level  Result Value Ref Range   Acetaminophen (Tylenol), Serum <15.0 10 - 30 ug/mL  CBC  Result Value Ref Range   WBC 6.8 4.0 - 10.5 K/uL   RBC 4.46 3.87 - 5.11 MIL/uL   Hemoglobin 13.0 12.0 - 15.0 g/dL   HCT 16.139.7 09.636.0 - 04.546.0 %   MCV 89.0 78.0 - 100.0 fL   MCH 29.1 26.0 - 34.0 pg   MCHC 32.7 30.0 - 36.0 g/dL   RDW 40.914.5 81.111.5 - 91.415.5 %   Platelets 345 150 - 400 K/uL  Comprehensive metabolic panel  Result Value Ref Range   Sodium 137 137 - 147 mEq/L   Potassium 4.1 3.7 - 5.3 mEq/L   Chloride 100 96 - 112 mEq/L   CO2 22 19 - 32 mEq/L   Glucose, Bld 70 70 - 99 mg/dL   BUN 7 6 - 23 mg/dL   Creatinine, Ser 7.820.76 0.50 - 1.10 mg/dL   Calcium 9.7 8.4 - 95.610.5 mg/dL   Total Protein 7.9 6.0 - 8.3 g/dL   Albumin 3.9 3.5 - 5.2 g/dL   AST 21 0 - 37 U/L   ALT 20 0 - 35 U/L   Alkaline Phosphatase 69 39 - 117 U/L   Total Bilirubin 0.3 0.3 - 1.2 mg/dL   GFR calc non Af Amer >90 >90 mL/min   GFR calc Af Amer >90 >90 mL/min   Anion gap 15 5 - 15  Ethanol (ETOH)  Result Value Ref Range   Alcohol, Ethyl (B) 16 (H) 0 - 11 mg/dL  Salicylate level  Result Value Ref Range   Salicylate Lvl <2.0 (L) 2.8 - 20.0 mg/dL  Urine Drug Screen  Result Value Ref Range   Opiates POSITIVE (A) NONE DETECTED   Cocaine POSITIVE (A) NONE DETECTED   Benzodiazepines NONE DETECTED NONE DETECTED   Amphetamines NONE DETECTED NONE DETECTED   Tetrahydrocannabinol NONE DETECTED NONE DETECTED   Barbiturates NONE DETECTED NONE DETECTED  Pregnancy, urine  Result Value Ref Range   Preg Test, Ur NEGATIVE NEGATIVE   No results found.   Imaging Review No results found.   EKG Interpretation None      MDM   Final diagnoses:  Polysubstance abuse    Patient with polysubstance abuse.  Will consult TTS.  TTS and psychiatry recommend inpatient treatment and will work on finding placement.    Roxy Horsemanobert Gorden Stthomas, PA-C 07/05/14  1818  Purvis SheffieldForrest Harrison, MD 07/07/14 [phone]

## 2014-07-05 NOTE — ED Notes (Signed)
Awake. Verbally responsive. A/O x4. Resp even and unlabored. No audible adventitious breath sounds noted. ABC's intact. Pt eaten 80% of meal. Pleasant and cooperative behavior. NAD noted.

## 2014-07-05 NOTE — ED Notes (Signed)
Awake. Verbally responsive. A/O x4. Resp even and unlabored. No audible adventitious breath sounds noted. ABC's intact. SR on monitor at 

## 2014-07-05 NOTE — ED Notes (Signed)
Resting quietly with eye closed. Easily arousable. Verbally responsive. Resp even and unlabored. ABC's intact. Pt c/o pain in bil lower leg. PA aware with NO noted. NAD noted.

## 2014-07-05 NOTE — ED Notes (Addendum)
Resting quietly with eye closed. Easily arousable. Verbally responsive. Resp even and unlabored. ABC's intact. No behavior problems noted. NAD noted.  

## 2014-07-05 NOTE — BH Assessment (Signed)
Pt has been referred to the following facilities:  [facility]

## 2014-07-05 NOTE — ED Notes (Signed)
TTS complete, it is suggested that she go for inpatient treatment. [facility], they will work toward finding placement.

## 2014-07-05 NOTE — ED Notes (Addendum)
Patient has been changed out, belongings bagged. Patient has 2 belonging bags. Security has wanded patient. Bags are labeled and [facility]

## 2014-07-05 NOTE — [facility] Counselor (Signed)
Assessment complete.  Per Karen Cline-NP Pt meets inpatient criteria. No [facility] beds. TTS to seek placement. Counselor informed Patty-RN and Robert-PA of the disposition.  Wolfgang PhoenixBrandi Krishna Heuer, [facility] Triage Specialist

## 2014-07-05 NOTE — ED Notes (Signed)
Pt alert and oriented x4. Respirations even and unlabored, bilateral symmetrical rise and fall of chest. Skin warm and dry. In no acute distress. Denies needs.   

## 2014-07-05 NOTE — BH Assessment (Addendum)
Tele Assessment Note   Karen Cline is an 28 y.o. female. The Pt arrived at [facility] ED voluntarily. Pt requests detox from alcohol, heroin, and Xanax. According to the Pt, she has been using all three substances daily. Pt reports drinking a pint of liquor a day, taking 4mg  of Xanax daily, and a gram of heroin daily. Pt reports no SI/HI. Pt reports no delusions or hallucinations. Pt reports she is experiencing the following withdrawal sypmtoms: seizures, shaking, lightedness, naueasa, and dirrehea. Pt also states she is depressed. Pt states the following depressive symptoms: tearfulness, isolating herself, depressed mood most of the day, and decreased sleep. Pt reports no prior inpatient or outpatient treatment. Pt states there is a family history of SA. As a result of her drug use the Pt states she has suffered financially, physically, and emotionally.  Pt appeared to be visibly sick throughout the assessment. Pt repeatedly stated she did not feel well.  The writer consulted with Karen Cline. Per Karen Cline the Pt meets inpatient criteria. No [facility] beds. TTS will seek placement.  RN and EDP informed of disposition.  Axis I: Substance Abuse Axis II: Deferred Axis III:  Past Medical History  Diagnosis Date  . Alcohol abuse   . Polysubstance abuse     Xanax, Heroin   Axis IV: economic problems, housing problems, occupational problems, problems related to social environment and problems with primary support group Axis V: 31-40 impairment in reality testing  Past Medical History:  Past Medical History  Diagnosis Date  . Alcohol abuse   . Polysubstance abuse     Xanax, Heroin    Past Surgical History  Procedure Laterality Date  . Ankle fracture surgery      history restored after error with record merge        Family History: History reviewed. No pertinent family history.  Social History:  reports that she has been smoking Cigarettes.  She has been smoking about 0.50 packs per day. She  does not have any smokeless tobacco history on file. She reports that she drinks alcohol. She reports that she uses illicit drugs.  Additional Social History:  Alcohol / Drug Use Pain Medications: Pt denies Prescriptions: Pt denies Over the Counter: Pt denies History of alcohol / drug use?: Yes Longest period of sobriety (when/how long): Unknown Negative Consequences of Use: Financial, Legal, Personal relationships, Work / School Withdrawal Symptoms: Irritability, Nausea / Vomiting, Diarrhea, Tingling, Seizures Onset of Seizures: 3 months ago Substance #1 Name of Substance 1: Xanax 1 - Age of First Use: 27 1 - Amount (size/oz): 4 mg 1 - Frequency: daily 1 - Duration: ongoing 1 - Last Use / Amount: 07/04/14 Substance #2 Name of Substance 2: Alcohol 2 - Age of First Use: 27 2 - Amount (size/oz): pint a day 2 - Frequency: daily 2 - Duration: ongoing 2 - Last Use / Amount: 07/04/14 Substance #3 Name of Substance 3: Heroin 3 - Age of First Use: 27 3 - Amount (size/oz):  a gram 3 - Frequency: daily 3 - Duration: ongoing 3 - Last Use / Amount: 07/04/14  CIWA: CIWA-Ar BP: 119/70 mmHg Pulse Rate: 65 Nausea and Vomiting: mild nausea with no vomiting Tactile Disturbances: very mild itching, pins and needles, burning or numbness Tremor: two Auditory Disturbances: not present Paroxysmal Sweats: barely perceptible sweating, palms moist Visual Disturbances: not present Anxiety: two Headache, Fullness in Head: very mild Agitation: normal activity Orientation and Clouding of Sensorium: oriented and can do serial additions CIWA-Ar Total: 8 COWS:  PATIENT STRENGTHS: (choose at least two) Communication skills Motivation for treatment/growth  Allergies:  Allergies  Allergen Reactions  . Tramadol     seizures  . Ultram [Tramadol]     seizures    Home Medications:  (Not in a hospital admission)  OB/GYN Status:  Patient's last menstrual period was 07/05/2014.  General  Assessment Data Location of Assessment: [facility] ED ACT Assessment: Yes Is this a Tele or Face-to-Face Assessment?: Tele Assessment Is this an Initial Assessment or a Re-assessment for this encounter?: Initial Assessment Living Arrangements: Alone Can pt return to current living arrangement?: Yes Admission Status: Voluntary Is patient capable of signing voluntary admission?: Yes Transfer from: Home Referral Source: Self/Family/Friend     [facility] Living Arrangements: Alone Name of Psychiatrist: Pt could not remember Name of Therapist: None  Education Status Is patient currently in school?: No Current Grade: NA Highest grade of school patient has completed: 12 Name of school: NA Contact person: NA  Risk to self with the past 6 months Suicidal Ideation: No Suicidal Intent: No Is patient at risk for suicide?: No Suicidal Plan?: No Access to Means: No What has been your use of drugs/alcohol within the last 12 months?: Alcohol, Xanax, heroin Previous Attempts/Gestures: No How many times?: 0 Other Self Harm Risks: None Triggers for Past Attempts: None known Intentional Self Injurious Behavior: None Family Suicide History: No Recent stressful life event(s): Conflict (Comment) (with family) Persecutory voices/beliefs?: No Depression: Yes Depression Symptoms: Insomnia, Tearfulness, Feeling worthless/self pity Substance abuse history and/or treatment for substance abuse?: Yes Suicide prevention information given to non-admitted patients: Not applicable  Risk to Others within the past 6 months Homicidal Ideation: No Thoughts of Harm to Others: No Current Homicidal Intent: No Current Homicidal Plan: No Access to Homicidal Means: No Identified Victim: None History of harm to others?: No Assessment of Violence: None Noted Violent Behavior Description: None Does patient have access to weapons?: No Criminal Charges Pending?: No Does patient have a court date:  No  Psychosis Hallucinations: None noted Delusions: None noted  Mental Status Report Appear/Hygiene: Unremarkable, In scrubs Eye Contact: Good Motor Activity: Tremors Speech: Logical/coherent Level of Consciousness: Alert Mood: Depressed Affect: Appropriate to circumstance Anxiety Level: Minimal Thought Processes: Coherent, Relevant Judgement: Unimpaired Orientation: Person, Place, Time, Situation Obsessive Compulsive Thoughts/Behaviors: None  Cognitive Functioning Concentration: Good Memory: Recent Intact, Remote Intact IQ: Average Insight: Fair Impulse Control: Fair Appetite: Poor Weight Loss: 0 Weight Gain: 0 Sleep: Decreased Total Hours of Sleep: 2 Vegetative Symptoms: None  ADLScreening [facility] Assessment Services) Patient's cognitive ability adequate to safely complete daily activities?: Yes Patient able to express need for assistance with ADLs?: Yes Independently performs ADLs?: Yes (appropriate for developmental age)  Prior Inpatient Therapy Prior Inpatient Therapy: No Prior Therapy Dates: NA Prior Therapy Facilty/Provider(s): NA Reason for Treatment: NA  Prior Outpatient Therapy Prior Outpatient Therapy: No Prior Therapy Dates: NA Prior Therapy Facilty/Provider(s): Na Reason for Treatment: NA  ADL Screening (condition at time of admission) Patient's cognitive ability adequate to safely complete daily activities?: Yes Is the patient deaf or have difficulty hearing?: No Does the patient have difficulty seeing, even when wearing glasses/contacts?: Yes Does the patient have difficulty concentrating, remembering, or making decisions?: No Patient able to express need for assistance with ADLs?: Yes Does the patient have difficulty dressing or bathing?: No Independently performs ADLs?: Yes (appropriate for developmental age) Does the patient have difficulty walking or climbing stairs?: No Weakness of Legs: None Weakness of Arms/Hands: None  Abuse/Neglect Assessment (Assessment to be complete while patient is alone) Physical Abuse: Denies Verbal Abuse: Denies Sexual Abuse: Denies Exploitation of patient/patient's resources: Denies Self-Neglect: Denies Values / Beliefs Cultural Requests During Hospitalization: None Spiritual Requests During Hospitalization: None   Advance Directives (For Healthcare) Does patient have an advance directive?: No Would patient like information on creating an advanced directive?: No - patient declined information    Additional Information 1:1 In Past 12 Months?: No CIRT Risk: No Elopement Risk: No Does patient have medical clearance?: Yes     Disposition:  Disposition Initial Assessment Completed for this Encounter: Yes Disposition of Patient: Inpatient treatment program Type of inpatient treatment program: Adult  Emmit PomfretLevette,Bona Hubbard D 07/05/2014 6:05 PM

## 2014-07-06 ENCOUNTER — Observation Stay (HOSPITAL_COMMUNITY)
Admission: AD | Admit: 2014-07-06 | Discharge: 2014-07-06 | Payer: Medicaid Other | Source: Intra-hospital | Attending: Psychiatry | Admitting: Psychiatry

## 2014-07-06 ENCOUNTER — Encounter (HOSPITAL_COMMUNITY): Payer: Self-pay | Admitting: *Deleted

## 2014-07-06 ENCOUNTER — Encounter (HOSPITAL_COMMUNITY): Payer: Self-pay | Admitting: Psychiatry

## 2014-07-06 DIAGNOSIS — F1120 Opioid dependence, uncomplicated: Secondary | ICD-10-CM | POA: Diagnosis present

## 2014-07-06 DIAGNOSIS — F1010 Alcohol abuse, uncomplicated: Secondary | ICD-10-CM | POA: Insufficient documentation

## 2014-07-06 DIAGNOSIS — F1994 Other psychoactive substance use, unspecified with psychoactive substance-induced mood disorder: Secondary | ICD-10-CM

## 2014-07-06 DIAGNOSIS — F1423 Cocaine dependence with withdrawal: Secondary | ICD-10-CM | POA: Insufficient documentation

## 2014-07-06 DIAGNOSIS — F1123 Opioid dependence with withdrawal: Principal | ICD-10-CM | POA: Insufficient documentation

## 2014-07-06 DIAGNOSIS — Z590 Homelessness: Secondary | ICD-10-CM | POA: Insufficient documentation

## 2014-07-06 MED ORDER — NAPROXEN 500 MG PO TABS
500.0000 mg | ORAL_TABLET | Freq: Two times a day (BID) | ORAL | Status: DC | PRN
  Administered 2014-07-06: 500 mg via ORAL
  Filled 2014-07-06: qty 1

## 2014-07-06 MED ORDER — GABAPENTIN 800 MG PO TABS
800.0000 mg | ORAL_TABLET | Freq: Three times a day (TID) | ORAL | Status: DC
  Filled 2014-07-06 (×6): qty 42

## 2014-07-06 MED ORDER — ONDANSETRON HCL 4 MG PO TABS: 4.0000 mg | ORAL_TABLET | Freq: Three times a day (TID) | ORAL | Status: DC | PRN

## 2014-07-06 MED ORDER — CLONIDINE HCL 0.1 MG PO TABS: 0.1000 mg | ORAL_TABLET | ORAL | Status: DC

## 2014-07-06 MED ORDER — LOPERAMIDE HCL 2 MG PO CAPS: 2.0000 mg | ORAL_CAPSULE | ORAL | Status: DC | PRN

## 2014-07-06 MED ORDER — DICYCLOMINE HCL 20 MG PO TABS: 20.0000 mg | ORAL_TABLET | Freq: Four times a day (QID) | ORAL | Status: DC | PRN

## 2014-07-06 MED ORDER — CLONIDINE HCL 0.1 MG PO TABS
0.1000 mg | ORAL_TABLET | Freq: Four times a day (QID) | ORAL | Status: DC
  Filled 2014-07-06 (×7): qty 1

## 2014-07-06 MED ORDER — ONDANSETRON 4 MG PO TBDP: 4.0000 mg | ORAL_TABLET | Freq: Four times a day (QID) | ORAL | Status: DC | PRN

## 2014-07-06 MED ORDER — LORAZEPAM 1 MG PO TABS: 1.0000 mg | ORAL_TABLET | Freq: Four times a day (QID) | ORAL | Status: DC | PRN

## 2014-07-06 MED ORDER — CITALOPRAM HYDROBROMIDE 40 MG PO TABS: 40.0000 mg | ORAL_TABLET | Freq: Every day | ORAL | Status: DC

## 2014-07-06 MED ORDER — IBUPROFEN 400 MG PO TABS: 400.0000 mg | ORAL_TABLET | Freq: Four times a day (QID) | ORAL | Status: DC | PRN

## 2014-07-06 MED ORDER — NAPROXEN 500 MG PO TABS: 500.0000 mg | ORAL_TABLET | Freq: Two times a day (BID) | ORAL | Status: DC | PRN

## 2014-07-06 MED ORDER — CLONIDINE HCL 0.1 MG PO TABS: 0.1000 mg | ORAL_TABLET | Freq: Every day | ORAL | Status: DC

## 2014-07-06 MED ORDER — CLONIDINE HCL 0.1 MG PO TABS
0.1000 mg | ORAL_TABLET | ORAL | Status: DC
  Filled 2014-07-06: qty 1

## 2014-07-06 MED ORDER — NICOTINE 21 MG/24HR TD PT24
21.0000 mg | MEDICATED_PATCH | Freq: Every day | TRANSDERMAL | Status: DC
  Administered 2014-07-06: 21 mg via TRANSDERMAL
  Filled 2014-07-06 (×4): qty 1

## 2014-07-06 MED ORDER — LORAZEPAM 1 MG PO TABS
1.0000 mg | ORAL_TABLET | Freq: Four times a day (QID) | ORAL | Status: DC | PRN
  Administered 2014-07-06: 1 mg via ORAL
  Filled 2014-07-06: qty 1

## 2014-07-06 MED ORDER — METHOCARBAMOL 500 MG PO TABS
500.0000 mg | ORAL_TABLET | Freq: Three times a day (TID) | ORAL | Status: DC | PRN
  Administered 2014-07-06: 500 mg via ORAL
  Filled 2014-07-06: qty 1

## 2014-07-06 MED ORDER — CLONIDINE HCL 0.1 MG PO TABS: 0.1000 mg | ORAL_TABLET | Freq: Four times a day (QID) | ORAL | Status: DC

## 2014-07-06 MED ORDER — HYDROXYZINE HCL 25 MG PO TABS: 25.0000 mg | ORAL_TABLET | Freq: Four times a day (QID) | ORAL | Status: DC | PRN

## 2014-07-06 MED ORDER — ONDANSETRON 4 MG PO TBDP
4.0000 mg | ORAL_TABLET | Freq: Four times a day (QID) | ORAL | Status: DC | PRN
  Administered 2014-07-06: 4 mg via ORAL
  Filled 2014-07-06: qty 1

## 2014-07-06 MED ORDER — GABAPENTIN 400 MG PO CAPS: 800.0000 mg | ORAL_CAPSULE | Freq: Three times a day (TID) | ORAL | Status: DC

## 2014-07-06 MED ORDER — CITALOPRAM HYDROBROMIDE 40 MG PO TABS
40.0000 mg | ORAL_TABLET | Freq: Every day | ORAL | Status: DC
  Filled 2014-07-06 (×2): qty 1

## 2014-07-06 MED ORDER — GABAPENTIN 400 MG PO CAPS
800.0000 mg | ORAL_CAPSULE | Freq: Three times a day (TID) | ORAL | Status: DC
  Administered 2014-07-06: 800 mg via ORAL
  Filled 2014-07-06 (×6): qty 2

## 2014-07-06 MED ORDER — METHOCARBAMOL 500 MG PO TABS
500.0000 mg | ORAL_TABLET | Freq: Three times a day (TID) | ORAL | Status: DC | PRN
Start: 2014-07-06 — End: 2014-07-06

## 2014-07-06 NOTE — Progress Notes (Signed)
Patient ID: Karen BaconBrittany L Meints, female   DOB: August 04, 1985, 28 y.o.   MRN: [id number] Discharge Note-Discharging from [facility] and transferring to [facility] to complete detox and continue there with rehab. She has had several prns for body aches and leg cramps. She has gotten some relief with medications. She continues to decline the Clonidine because she doesn't like the way it makes her feel. [facility] staff [facility] All of her property returned to her. Reviewed with her her discharge and verbalizes her understanding and wants to go. Pleasant.

## 2014-07-06 NOTE — H&P (Signed)
[facility] H[facility]P  Reason for Consult:  Heroin dependence, alcohol abuse  Referring Physician:  EDP  [facility] is an 28 y.o. female. Total Time spent with patient: 45 minutes  Assessment: AXIS I:  Alcohol Abuse; substance induced mood disorder, heroin dependence AXIS II:  Deferred AXIS III:   Past Medical History  Diagnosis Date  . Alcohol abuse   . Polysubstance abuse     Xanax, Heroin   AXIS IV:  housing problems, other psychosocial or environmental problems, problems related to social environment and problems with primary support group AXIS V:  51-60 moderate symptoms  Plan:  Supportive therapy provided about ongoing stressors. -[facility] a [facility] at [facility] and will discharge there today. She does not have committable criteria (no SI, HI, or AVH).   Subjective:   [facility] is a 28 y.o. female patient admitted to [facility] for stabilization. Pt seen and chart reviewed. Pt denies SI, HI, and AVH, contracts for safety. Her primary concern is heroin dependence, benzodiazepine dependence, and alcohol dependence, for which she has been accepted inpatient at [facility] with a bed there today.   HPI:  The patient stated she was using 2-4 mg of Xanax daily for the past six months, a pint to a fifth of alcohol daily x 5 months, and 1/2 gram of heroin daily for 8 months.  However, her drug screen was only positive for opiates and cocaine, minimal/trace of alcohol.  She denies suicidal ideations, denies homicidal ideations, 4 heroin overdoses in the past but not this time.  Denies hallucinations.  Client at [facility] and was diagnosed with Bipolar 3 years ago and is currently only taking gabapentin as a mood stabilizer.  [facility] would like to go to [facility] or [facility] for recovery.  Homeless at this time.  HPI Elements:   Location:  generalized. Quality:  acute. Severity:  moderate. Timing:  intermittent. Duration:  8 months. Context:  stressors.  Past Psychiatric History: Past  Medical History  Diagnosis Date  . Alcohol abuse   . Polysubstance abuse     Xanax, Heroin    reports that she has been smoking Cigarettes.  She has been smoking about 0.50 packs per day. She does not have any smokeless tobacco history on file. She reports that she drinks alcohol. She reports that she uses illicit drugs. History reviewed. No pertinent family history.   Living Arrangements: Alone   Abuse/Neglect [facility]) Physical Abuse: Denies Verbal Abuse: Denies Sexual Abuse: Denies Allergies:   Allergies  Allergen Reactions  . Tramadol     seizures  . Ultram [Tramadol]     seizures    ACT Assessment Complete:  Yes:    Educational Status    Risk to Self: Risk to self with the past 6 months Is patient at risk for suicide?: No  Risk to Others:    Abuse: Abuse/Neglect Assessment (Assessment to be complete while patient is alone) Physical Abuse: Denies Verbal Abuse: Denies Sexual Abuse: Denies Exploitation of patient/patient's resources: Denies Self-Neglect: Denies  Prior Inpatient Therapy:    Prior Outpatient Therapy:    Additional Information:                    Objective: Blood pressure 114/75, pulse 82, temperature 98.3 F (36.8 C), temperature source Oral, resp. rate 18, height 5' 4" (1.626 m), weight 68.04 kg (150 lb), last menstrual period 07/05/2014.Body mass index is 25.73 kg/(m^2). Results for orders placed or performed during the [facility] encounter  of 07/05/14 (from the past 72 hour(s))  Acetaminophen level     Status: None   Collection Time: 07/05/14  4:21 PM  Result Value Ref Range   Acetaminophen (Tylenol), Serum <15.0 10 - 30 ug/mL    Comment:        THERAPEUTIC CONCENTRATIONS VARY SIGNIFICANTLY. A RANGE OF 10-30 ug/mL MAY BE AN EFFECTIVE CONCENTRATION FOR MANY PATIENTS. HOWEVER, SOME ARE BEST TREATED AT CONCENTRATIONS OUTSIDE THIS RANGE. ACETAMINOPHEN CONCENTRATIONS >150 ug/mL AT 4 HOURS AFTER INGESTION AND >50 ug/mL AT 12 HOURS AFTER  INGESTION ARE OFTEN ASSOCIATED WITH TOXIC REACTIONS.   CBC     Status: None   Collection Time: 07/05/14  4:21 PM  Result Value Ref Range   WBC 6.8 4.0 - 10.5 K/uL   RBC 4.46 3.87 - 5.11 MIL/uL   Hemoglobin 13.0 12.0 - 15.0 g/dL   HCT 39.7 36.0 - 46.0 %   MCV 89.0 78.0 - 100.0 fL   MCH 29.1 26.0 - 34.0 pg   MCHC 32.7 30.0 - 36.0 g/dL   RDW 14.5 11.5 - 15.5 %   Platelets 345 150 - 400 K/uL  Comprehensive metabolic panel     Status: None   Collection Time: 07/05/14  4:21 PM  Result Value Ref Range   Sodium 137 137 - 147 mEq/L   Potassium 4.1 3.7 - 5.3 mEq/L   Chloride 100 96 - 112 mEq/L   CO2 22 19 - 32 mEq/L   Glucose, Bld 70 70 - 99 mg/dL   BUN 7 6 - 23 mg/dL   Creatinine, Ser 0.76 0.50 - 1.10 mg/dL   Calcium 9.7 8.4 - 10.5 mg/dL   Total Protein 7.9 6.0 - 8.3 g/dL   Albumin 3.9 3.5 - 5.2 g/dL   AST 21 0 - 37 U/L   ALT 20 0 - 35 U/L   Alkaline Phosphatase 69 39 - 117 U/L   Total Bilirubin 0.3 0.3 - 1.2 mg/dL   GFR calc non Af Amer >90 >90 mL/min   GFR calc Af Amer >90 >90 mL/min    Comment: (NOTE) The eGFR has been calculated using the CKD EPI equation. This calculation has not been validated in all clinical situations. eGFR's persistently <90 mL/min signify possible Chronic Kidney Disease.    Anion gap 15 5 - 15  Ethanol (ETOH)     Status: Abnormal   Collection Time: 07/05/14  4:21 PM  Result Value Ref Range   Alcohol, Ethyl (B) 16 (H) 0 - 11 mg/dL    Comment:        LOWEST DETECTABLE LIMIT FOR SERUM ALCOHOL IS 11 mg/dL FOR MEDICAL PURPOSES ONLY   Salicylate level     Status: Abnormal   Collection Time: 07/05/14  4:21 PM  Result Value Ref Range   Salicylate Lvl <3.8 (L) 2.8 - 20.0 mg/dL  Urine Drug Screen     Status: Abnormal   Collection Time: 07/05/14  4:26 PM  Result Value Ref Range   Opiates POSITIVE (A) NONE DETECTED   Cocaine POSITIVE (A) NONE DETECTED   Benzodiazepines NONE DETECTED NONE DETECTED   Amphetamines NONE DETECTED NONE DETECTED    Tetrahydrocannabinol NONE DETECTED NONE DETECTED   Barbiturates NONE DETECTED NONE DETECTED    Comment:        DRUG SCREEN FOR MEDICAL PURPOSES ONLY.  IF CONFIRMATION IS NEEDED FOR ANY PURPOSE, NOTIFY LAB WITHIN 5 DAYS.        LOWEST DETECTABLE LIMITS FOR URINE DRUG SCREEN Drug Class  Cutoff (ng/mL) Amphetamine      1000 Barbiturate      200 Benzodiazepine   035 Tricyclics       009 Opiates          300 Cocaine          300 THC              50   Pregnancy, urine     Status: None   Collection Time: 07/05/14  4:50 PM  Result Value Ref Range   Preg Test, Ur NEGATIVE NEGATIVE    Comment:        THE SENSITIVITY OF THIS METHODOLOGY IS >20 mIU/mL.    Labs are reviewed and are pertinent for no medical issues.  Current Facility-Administered Medications  Medication Dose Route Frequency Provider Last Rate Last Dose  . [START ON 07/07/2014] citalopram (CELEXA) tablet 40 mg  40 mg Oral Daily Waylan Boga, NP      . cloNIDine (CATAPRES) tablet 0.1 mg  0.1 mg Oral QID Waylan Boga, NP   0.1 mg at 07/06/14 1246   Followed by  . [START ON 07/08/2014] cloNIDine (CATAPRES) tablet 0.1 mg  0.1 mg Oral BH-qamhs Waylan Boga, NP       Followed by  . [START ON 07/10/2014] cloNIDine (CATAPRES) tablet 0.1 mg  0.1 mg Oral QAC breakfast Waylan Boga, NP      . dicyclomine (BENTYL) tablet 20 mg  20 mg Oral Q6H PRN Waylan Boga, NP      . gabapentin (NEURONTIN) capsule 800 mg  800 mg Oral TID Waylan Boga, NP      . gabapentin (NEURONTIN) tablet 800 mg  800 mg Oral TID Nicholaus Bloom, MD      . hydrOXYzine (ATARAX/VISTARIL) tablet 25 mg  25 mg Oral Q6H PRN Waylan Boga, NP      . ibuprofen (ADVIL,MOTRIN) tablet 400 mg  400 mg Oral Q6H PRN Waylan Boga, NP      . loperamide (IMODIUM) capsule 2-4 mg  2-4 mg Oral PRN Waylan Boga, NP      . LORazepam (ATIVAN) tablet 1 mg  1 mg Oral Q6H PRN Waylan Boga, NP   1 mg at 07/06/14 1242  . methocarbamol (ROBAXIN) tablet 500 mg  500 mg Oral Q8H PRN Waylan Boga, NP      . naproxen (NAPROSYN) tablet 500 mg  500 mg Oral BID PRN Waylan Boga, NP   500 mg at 07/06/14 1245  . nicotine (NICODERM CQ - dosed in mg/24 hours) patch 21 mg  21 mg Transdermal Daily Benjamine Mola, FNP   21 mg at 07/06/14 1242  . ondansetron (ZOFRAN-ODT) disintegrating tablet 4 mg  4 mg Oral Q6H PRN Waylan Boga, NP        Psychiatric Specialty Exam:     Blood pressure 114/75, pulse 82, temperature 98.3 F (36.8 C), temperature source Oral, resp. rate 18, height 5' 4" (1.626 m), weight 68.04 kg (150 lb), last menstrual period 07/05/2014.Body mass index is 25.73 kg/(m^2).  General Appearance: Casual  Eye Contact::  Good  Speech:  Normal Rate  Volume:  Normal  Mood:  Depressed, low level  Affect:  Congruent  Thought Process:  Coherent  Orientation:  Full (Time, Place, and Person)  Thought Content:  WDL  Suicidal Thoughts:  No  Homicidal Thoughts:  No  Memory:  Immediate;   Good Recent;   Good Remote;   Good  Judgement:  Fair  Insight:  Fair  Psychomotor Activity:  Normal  Concentration:  Good  Recall:  Good  Fund of Knowledge:Good  Language: Good  Akathisia:  No  Handed:  Right  AIMS (if indicated):     Assets:  Leisure Time Physical Health Resilience  Sleep:      Musculoskeletal: Strength & Muscle Tone: within normal limits Gait & Station: normal Patient leans: N/A  Treatment Plan Summary: Clonidine opiate detox protocol -Admit to [facility] today.  Benjamine Mola, FNP-BC 07/06/2014 1:40 PM  Case discussed with me as above

## 2014-07-06 NOTE — ED Notes (Signed)
Resting quietly with eye closed. Easily arousable. Verbally responsive. Resp even and unlabored. No audible adventitious breath sounds noted. ABC's intact. NAD noted.  

## 2014-07-06 NOTE — ED Notes (Signed)
Resting quietly with eye closed. Easily arousable. Verbally responsive. Resp even and unlabored. ABC's intact. No behavior problems noted. NAD noted.  

## 2014-07-06 NOTE — Progress Notes (Signed)
[facility] INPATIENT:  Family/Significant Other Suicide Prevention Education  Suicide Prevention Education:  Patient Refusal for Family/Significant Other Suicide Prevention Education: The patient [facility] has refused to provide written consent for family/significant other to be provided Family/Significant Other Suicide Prevention Education during admission and/or prior to discharge.  Physician notified.  [facility], [facility] 07/06/2014, 4:22 PM

## 2014-07-06 NOTE — Consult Note (Signed)
[facility] Consult   Reason for Consult:  Heroin dependence, alcohol abuse  Referring Physician:  EDP  [facility] is an 28 y.o. female. Total Time spent with patient: 45 minutes  Assessment: AXIS I:  Alcohol Abuse; substance induced mood disorder, heroin dependence AXIS II:  Deferred AXIS III:   Past Medical History  Diagnosis Date  . Alcohol abuse   . Polysubstance abuse     Xanax, Heroin   AXIS IV:  housing problems, other psychosocial or environmental problems, problems related to social environment and problems with primary support group AXIS V:  51-60 moderate symptoms  Plan:  Supportive therapy provided about ongoing stressors.  Subjective:   [facility] is a 28 y.o. female patient admitted to [facility] observation unit for stabilization.  HPI:  The patient stated she was using 2-4 mg of Xanax daily for the past six months, a pint to a fifth of alcohol daily x 5 months, and 1/2 gram of heroin daily for 8 months.  However, her drug screen was only positive for opiates and cocaine, minimal/trace of alcohol.  She denies suicidal ideations, denies homicidal ideations, 4 heroin overdoses in the past but not this time.  Denies hallucinations.  Client at [facility] and was diagnosed with Bipolar 3 years ago and is currently only taking gabapentin as a mood stabilizer.  [place] would like to go to [facility] or [facility] for recovery.  Homeless at this time. HPI Elements:   Location:  generalized. Quality:  acute. Severity:  moderate. Timing:  intermittent. Duration:  8 months. Context:  stressors.  Past Psychiatric History: Past Medical History  Diagnosis Date  . Alcohol abuse   . Polysubstance abuse     Xanax, Heroin    reports that she has been smoking Cigarettes.  She has been smoking about 0.50 packs per day. She does not have any smokeless tobacco history on file. She reports that she drinks alcohol. She reports that she uses illicit drugs. History reviewed.  No pertinent family history. Family History Substance Abuse: Yes, Describe: (parent) Family Supports: No Living Arrangements: Alone Can pt return to current living arrangement?: Yes Abuse/Neglect [facility]) Physical Abuse: Denies Verbal Abuse: Denies Sexual Abuse: Denies Allergies:   Allergies  Allergen Reactions  . Tramadol     seizures  . Ultram [Tramadol]     seizures    ACT Assessment Complete:  Yes:    Educational Status    Risk to Self: Risk to self with the past 6 months Suicidal Ideation: No Suicidal Intent: No Is patient at risk for suicide?: No Suicidal Plan?: No Access to Means: No What has been your use of drugs/alcohol within the last 12 months?: Alcohol, Xanax, heroin Previous Attempts/Gestures: No How many times?: 0 Other Self Harm Risks: None Triggers for Past Attempts: None known Intentional Self Injurious Behavior: None Family Suicide History: No Recent stressful life event(s): Conflict (Comment) (with family) Persecutory voices/beliefs?: No Depression: Yes Depression Symptoms: Insomnia, Tearfulness, Feeling worthless/self pity Substance abuse history and/or treatment for substance abuse?: Yes Suicide prevention information given to non-admitted patients: Not applicable  Risk to Others: Risk to Others within the past 6 months Homicidal Ideation: No Thoughts of Harm to Others: No Current Homicidal Intent: No Current Homicidal Plan: No Access to Homicidal Means: No Identified Victim: None History of harm to others?: No Assessment of Violence: None Noted Violent Behavior Description: None Does patient have access to weapons?: No Criminal Charges Pending?: No Does patient have a court date: No  Abuse: Abuse/Neglect Assessment (Assessment to be complete while patient is alone) Physical Abuse: Denies Verbal Abuse: Denies Sexual Abuse: Denies Exploitation of patient/patient's resources: Denies Self-Neglect: Denies  Prior Inpatient Therapy: Prior  Inpatient Therapy Prior Inpatient Therapy: No Prior Therapy Dates: NA Prior Therapy Facilty/Provider(s): NA Reason for Treatment: NA  Prior Outpatient Therapy: Prior Outpatient Therapy Prior Outpatient Therapy: No Prior Therapy Dates: NA Prior Therapy Facilty/Provider(s): Na Reason for Treatment: NA  Additional Information: Additional Information 1:1 In Past 12 Months?: No CIRT Risk: No Elopement Risk: No Does patient have medical clearance?: Yes                  Objective: Blood pressure 100/52, pulse 65, temperature 98.1 F (36.7 C), temperature source Oral, resp. rate 16, last menstrual period 07/05/2014, SpO2 100 %.There is no weight on file to calculate BMI. Results for orders placed or performed during the hospital encounter of 07/05/14 (from the past 72 hour(s))  Acetaminophen level     Status: None   Collection Time: 07/05/14  4:21 PM  Result Value Ref Range   Acetaminophen (Tylenol), Serum <15.0 10 - 30 ug/mL    Comment:        THERAPEUTIC CONCENTRATIONS VARY SIGNIFICANTLY. A RANGE OF 10-30 ug/mL MAY BE AN EFFECTIVE CONCENTRATION FOR MANY PATIENTS. HOWEVER, SOME ARE BEST TREATED AT CONCENTRATIONS OUTSIDE THIS RANGE. ACETAMINOPHEN CONCENTRATIONS >150 ug/mL AT 4 HOURS AFTER INGESTION AND >50 ug/mL AT 12 HOURS AFTER INGESTION ARE OFTEN ASSOCIATED WITH TOXIC REACTIONS.   CBC     Status: None   Collection Time: 07/05/14  4:21 PM  Result Value Ref Range   WBC 6.8 4.0 - 10.5 K/uL   RBC 4.46 3.87 - 5.11 MIL/uL   Hemoglobin 13.0 12.0 - 15.0 g/dL   HCT 39.7 36.0 - 46.0 %   MCV 89.0 78.0 - 100.0 fL   MCH 29.1 26.0 - 34.0 pg   MCHC 32.7 30.0 - 36.0 g/dL   RDW 14.5 11.5 - 15.5 %   Platelets 345 150 - 400 K/uL  Comprehensive metabolic panel     Status: None   Collection Time: 07/05/14  4:21 PM  Result Value Ref Range   Sodium 137 137 - 147 mEq/L   Potassium 4.1 3.7 - 5.3 mEq/L   Chloride 100 96 - 112 mEq/L   CO2 22 19 - 32 mEq/L   Glucose, Bld 70 70  - 99 mg/dL   BUN 7 6 - 23 mg/dL   Creatinine, Ser 0.76 0.50 - 1.10 mg/dL   Calcium 9.7 8.4 - 10.5 mg/dL   Total Protein 7.9 6.0 - 8.3 g/dL   Albumin 3.9 3.5 - 5.2 g/dL   AST 21 0 - 37 U/L   ALT 20 0 - 35 U/L   Alkaline Phosphatase 69 39 - 117 U/L   Total Bilirubin 0.3 0.3 - 1.2 mg/dL   GFR calc non Af Amer >90 >90 mL/min   GFR calc Af Amer >90 >90 mL/min    Comment: (NOTE) The eGFR has been calculated using the CKD EPI equation. This calculation has not been validated in all clinical situations. eGFR's persistently <90 mL/min signify possible Chronic Kidney Disease.    Anion gap 15 5 - 15  Ethanol (ETOH)     Status: Abnormal   Collection Time: 07/05/14  4:21 PM  Result Value Ref Range   Alcohol, Ethyl (B) 16 (H) 0 - 11 mg/dL    Comment:        LOWEST DETECTABLE LIMIT  FOR SERUM ALCOHOL IS 11 mg/dL FOR MEDICAL PURPOSES ONLY   Salicylate level     Status: Abnormal   Collection Time: 07/05/14  4:21 PM  Result Value Ref Range   Salicylate Lvl <6.1 (L) 2.8 - 20.0 mg/dL  Urine Drug Screen     Status: Abnormal   Collection Time: 07/05/14  4:26 PM  Result Value Ref Range   Opiates POSITIVE (A) NONE DETECTED   Cocaine POSITIVE (A) NONE DETECTED   Benzodiazepines NONE DETECTED NONE DETECTED   Amphetamines NONE DETECTED NONE DETECTED   Tetrahydrocannabinol NONE DETECTED NONE DETECTED   Barbiturates NONE DETECTED NONE DETECTED    Comment:        DRUG SCREEN FOR MEDICAL PURPOSES ONLY.  IF CONFIRMATION IS NEEDED FOR ANY PURPOSE, NOTIFY LAB WITHIN 5 DAYS.        LOWEST DETECTABLE LIMITS FOR URINE DRUG SCREEN Drug Class       Cutoff (ng/mL) Amphetamine      1000 Barbiturate      200 Benzodiazepine   443 Tricyclics       154 Opiates          300 Cocaine          300 THC              50   Pregnancy, urine     Status: None   Collection Time: 07/05/14  4:50 PM  Result Value Ref Range   Preg Test, Ur NEGATIVE NEGATIVE    Comment:        THE SENSITIVITY OF THIS METHODOLOGY IS  >20 mIU/mL.    Labs are reviewed and are pertinent for no medical issues.  Current Facility-Administered Medications  Medication Dose Route Frequency Provider Last Rate Last Dose  . citalopram (CELEXA) tablet 40 mg  40 mg Oral Daily Montine Circle, PA-C   40 mg at 07/06/14 0004  . cloNIDine (CATAPRES) tablet 0.1 mg  0.1 mg Oral BID Montine Circle, PA-C   0.1 mg at 07/05/14 2300  . gabapentin (NEURONTIN) capsule 800 mg  800 mg Oral TID Montine Circle, PA-C   800 mg at 07/05/14 2257  . ibuprofen (ADVIL,MOTRIN) tablet 400 mg  400 mg Oral Q6H PRN Montine Circle, PA-C   400 mg at 07/06/14 0857  . LORazepam (ATIVAN) tablet 0-4 mg  0-4 mg Oral 4 times per day Montine Circle, PA-C   2 mg at 07/06/14 0004   Followed by  . [START ON 07/07/2014] LORazepam (ATIVAN) tablet 0-4 mg  0-4 mg Oral Q12H Montine Circle, PA-C      . ondansetron [facility]) tablet 4 mg  4 mg Oral Q8H PRN Montine Circle, PA-C   4 mg at 07/05/14 0086   Current Outpatient Prescriptions  Medication Sig Dispense Refill  . citalopram (CELEXA) 40 MG tablet Take 1 tablet (40 mg total) by mouth daily. 7 tablet 0  . gabapentin (NEURONTIN) 400 MG capsule Take 2 capsules (800 mg total) by mouth 3 (three) times daily. 42 capsule 0  . ibuprofen (ADVIL,MOTRIN) 200 MG tablet Take 400 mg by mouth every 6 (six) hours as needed for moderate pain (tooth pain).    . cloNIDine (CATAPRES) 0.2 MG tablet Take 0.5 tablets (0.1 mg total) by mouth 2 (two) times daily. (Patient not taking: Reported on 07/05/2014) 10 tablet 0    Psychiatric Specialty Exam:     Blood pressure 100/52, pulse 65, temperature 98.1 F (36.7 C), temperature source Oral, resp. rate 16, last menstrual period 07/05/2014, SpO2  100 %.There is no weight on file to calculate BMI.  General Appearance: Casual  Eye Contact::  Good  Speech:  Normal Rate  Volume:  Normal  Mood:  Depressed, low level  Affect:  Congruent  Thought Process:  Coherent  Orientation:  Full (Time, Place,  and Person)  Thought Content:  WDL  Suicidal Thoughts:  No  Homicidal Thoughts:  No  Memory:  Immediate;   Good Recent;   Good Remote;   Good  Judgement:  Fair  Insight:  Fair  Psychomotor Activity:  Normal  Concentration:  Good  Recall:  Good  Fund of Knowledge:Good  Language: Good  Akathisia:  No  Handed:  Right  AIMS (if indicated):     Assets:  Leisure Time Physical Health Resilience  Sleep:      Musculoskeletal: Strength & Muscle Tone: within normal limits Gait & Station: normal Patient leans: N/A  Treatment Plan Summary: Clonidine opiate detox protocol, admit to [facility] observation unit for stabilization.  Waylan Boga, [place] 07/06/2014 10:39 AM  Patient seen, evaluated and I agree with notes by Nurse Practitioner. Corena Pilgrim, MD

## 2014-07-06 NOTE — ED Notes (Signed)
Resting quietly with eye closed. Easily arousable. Verbally responsive. Resp even and unlabored. ABC's intact. NAD noted. No behavior problems noted.   

## 2014-07-06 NOTE — Progress Notes (Signed)
Patient ID: Catheryn BaconBrittany L Nodine, female   DOB: [birth year], 28 y.o.   MRN: [id number] [facility] that client has been accepted to their program today at 1745 and that [facility]. Requested that she be sent there with three week supply of medications as she will be there for detox and rehab. She is glad to be going to their program. She took a shower, had medications for detox and is feeling slightly better.

## 2014-07-06 NOTE — Progress Notes (Signed)
Patient ID: Catheryn BaconBrittany L Fetsch, female   DOB: 1985/11/04, 28 y.o.   MRN: [id number] Admission Note- [facility] to continue detox. She is detoxing from Heroin, cocaine, and pain medications. She also has a history of alcohol use but her alcohol level was 16. She denies thoughts to hurt self or others. [facility].She is having sx of nausea, anxiety, tremor, loose stools and general body aches.She is cooperative with the admission process. She is currently homeless. She has been living with drug using friends and isnt able to return there for her sobriety.She has no support system. [place] father [place] in [place] and her mom has remarried and she doesn't get along with her step father. Her siblings are younger than she is and she doesn't have much relationship with them.She last used substances two days ago. All property locked up in locker 52. She was given a snack and drink on arrival, not wanting lunch with her current nausea and loose stools. She has no support system currently.

## 2014-07-06 NOTE — ED Notes (Signed)
Patient informed she will be going across the street to room 3 in observation unit.  Report called, vitals reassessed.

## 2014-07-06 NOTE — BH Assessment (Signed)
[facility] Assessment Progress Note  Pt has been accepted to Observation Unit Bed 6 by Thedore MinsMojeed Akintayo, MD.  Pt has signed Voluntary Admission and Consent for Treatment as well as Consent to Release Information, both of which [facility].  Pt's nurse has been informed.  She agrees to send original forms [facility] along with pt via [place] [place], and to call report to [phone] or [phone].  Doylene Canninghomas Brittney Caraway, MA Triage Specialist 07/06/2014 @ 11:19

## 2014-07-06 NOTE — ED Notes (Signed)
Belongings bags x2 locked in locker #29

## 2014-07-06 NOTE — ED Notes (Signed)
Bed: WA29 Expected date:  Expected time:  Means of arrival:  Comments: Rolene CourseFarley, [facility]

## 2014-07-06 NOTE — Progress Notes (Signed)
Patient ID: [facility], female   DOB: Dec 15, 1985, 28 y.o.   MRN: [id number] Correction to earlier note, she will be leaving for [facility] at 1845 not 1745.

## 2014-07-08 NOTE — Discharge Summary (Signed)
[facility] OBS UNIT DISCHARGE SUMMARY  Reason for Consult:  Heroin dependence, alcohol abuse  Referring Physician:  EDP  Karen Cline is an 28 y.o. female. Total Time spent with patient: 45 minutes  Assessment: AXIS I:  Alcohol Abuse; substance induced mood disorder, heroin dependence AXIS II:  Deferred AXIS III:   Past Medical History  Diagnosis Date  . Alcohol abuse   . Polysubstance abuse     Xanax, Heroin   AXIS IV:  housing problems, other psychosocial or environmental problems, problems related to social environment and problems with primary support group AXIS V:  51-60 moderate symptoms  Plan:  Supportive therapy provided about ongoing stressors. -Pt has a bed at [facility] and will discharge there today. She does not have committable criteria (no SI, HI, or AVH).   Subjective:   Karen Cline is a 28 y.o. female patient admitted to [facility] observation unit for stabilization. Pt seen and chart reviewed. Pt denies SI, HI, and AVH, contracts for safety. Her primary concern is heroin dependence, benzodiazepine dependence, and alcohol dependence, for which she has been accepted inpatient at [facility] with a bed there today.   HPI:  The patient stated she was using 2-4 mg of Xanax daily for the past six months, a pint to a fifth of alcohol daily x 5 months, and 1/2 gram of heroin daily for 8 months.  However, her drug screen was only positive for opiates and cocaine, minimal/trace of alcohol.  She denies suicidal ideations, denies homicidal ideations, 4 heroin overdoses in the past but not this time.  Denies hallucinations.  Client at [facility] and was diagnosed with Bipolar 3 years ago and is currently only taking gabapentin as a mood stabilizer.  [place] would like to go to [facility] or [facility] for recovery.  Homeless at this time.  HPI Elements:   Location:  generalized. Quality:  acute. Severity:  moderate. Timing:  intermittent. Duration:  8 months. Context:  stressors.  Past Psychiatric  History: Past Medical History  Diagnosis Date  . Alcohol abuse   . Polysubstance abuse     Xanax, Heroin    reports that she has been smoking Cigarettes.  She has been smoking about 0.50 packs per day. She does not have any smokeless tobacco history on file. She reports that she drinks alcohol. She reports that she uses illicit drugs. History reviewed. No pertinent family history.   Living Arrangements: Alone   Abuse/Neglect [facility]) Physical Abuse: Denies Verbal Abuse: Denies Sexual Abuse: Denies Allergies:   Allergies  Allergen Reactions  . Tramadol     seizures  . Ultram [Tramadol]     seizures    ACT Assessment Complete:  Yes:    Educational Status    Risk to Self: Risk to self with the past 6 months Is patient at risk for suicide?: No  Risk to Others:    Abuse: Abuse/Neglect Assessment (Assessment to be complete while patient is alone) Physical Abuse: Denies Verbal Abuse: Denies Sexual Abuse: Denies Exploitation of patient/patient's resources: Denies Self-Neglect: Denies  Prior Inpatient Therapy:    Prior Outpatient Therapy:    Additional Information:                    Objective: Blood pressure 114/75, pulse 82, temperature 98.3 F (36.8 C), temperature source Oral, resp. rate 18, height 5' 4"  (1.626 m), weight 68.04 kg (150 lb), last menstrual period 07/05/2014.Body mass index is 25.73 kg/(m^2). Results for orders placed or performed during the hospital  encounter of 07/05/14 (from the past 72 hour(s))  Acetaminophen level     Status: None   Collection Time: 07/05/14  4:21 PM  Result Value Ref Range   Acetaminophen (Tylenol), Serum <15.0 10 - 30 ug/mL    Comment:        THERAPEUTIC CONCENTRATIONS VARY SIGNIFICANTLY. A RANGE OF 10-30 ug/mL MAY BE AN EFFECTIVE CONCENTRATION FOR MANY PATIENTS. HOWEVER, SOME ARE BEST TREATED AT CONCENTRATIONS OUTSIDE THIS RANGE. ACETAMINOPHEN CONCENTRATIONS >150 ug/mL AT 4 HOURS AFTER INGESTION AND >50 ug/mL AT  12 HOURS AFTER INGESTION ARE OFTEN ASSOCIATED WITH TOXIC REACTIONS.   CBC     Status: None   Collection Time: 07/05/14  4:21 PM  Result Value Ref Range   WBC 6.8 4.0 - 10.5 K/uL   RBC 4.46 3.87 - 5.11 MIL/uL   Hemoglobin 13.0 12.0 - 15.0 g/dL   HCT 39.7 36.0 - 46.0 %   MCV 89.0 78.0 - 100.0 fL   MCH 29.1 26.0 - 34.0 pg   MCHC 32.7 30.0 - 36.0 g/dL   RDW 14.5 11.5 - 15.5 %   Platelets 345 150 - 400 K/uL  Comprehensive metabolic panel     Status: None   Collection Time: 07/05/14  4:21 PM  Result Value Ref Range   Sodium 137 137 - 147 mEq/L   Potassium 4.1 3.7 - 5.3 mEq/L   Chloride 100 96 - 112 mEq/L   CO2 22 19 - 32 mEq/L   Glucose, Bld 70 70 - 99 mg/dL   BUN 7 6 - 23 mg/dL   Creatinine, Ser 0.76 0.50 - 1.10 mg/dL   Calcium 9.7 8.4 - 10.5 mg/dL   Total Protein 7.9 6.0 - 8.3 g/dL   Albumin 3.9 3.5 - 5.2 g/dL   AST 21 0 - 37 U/L   ALT 20 0 - 35 U/L   Alkaline Phosphatase 69 39 - 117 U/L   Total Bilirubin 0.3 0.3 - 1.2 mg/dL   GFR calc non Af Amer >90 >90 mL/min   GFR calc Af Amer >90 >90 mL/min    Comment: (NOTE) The eGFR has been calculated using the CKD EPI equation. This calculation has not been validated in all clinical situations. eGFR's persistently <90 mL/min signify possible Chronic Kidney Disease.    Anion gap 15 5 - 15  Ethanol (ETOH)     Status: Abnormal   Collection Time: 07/05/14  4:21 PM  Result Value Ref Range   Alcohol, Ethyl (B) 16 (H) 0 - 11 mg/dL    Comment:        LOWEST DETECTABLE LIMIT FOR SERUM ALCOHOL IS 11 mg/dL FOR MEDICAL PURPOSES ONLY   Salicylate level     Status: Abnormal   Collection Time: 07/05/14  4:21 PM  Result Value Ref Range   Salicylate Lvl <1.6 (L) 2.8 - 20.0 mg/dL  Urine Drug Screen     Status: Abnormal   Collection Time: 07/05/14  4:26 PM  Result Value Ref Range   Opiates POSITIVE (A) NONE DETECTED   Cocaine POSITIVE (A) NONE DETECTED   Benzodiazepines NONE DETECTED NONE DETECTED   Amphetamines NONE DETECTED NONE  DETECTED   Tetrahydrocannabinol NONE DETECTED NONE DETECTED   Barbiturates NONE DETECTED NONE DETECTED    Comment:        DRUG SCREEN FOR MEDICAL PURPOSES ONLY.  IF CONFIRMATION IS NEEDED FOR ANY PURPOSE, NOTIFY LAB WITHIN 5 DAYS.        LOWEST DETECTABLE LIMITS FOR URINE DRUG SCREEN Drug Class  Cutoff (ng/mL) Amphetamine      1000 Barbiturate      200 Benzodiazepine   983 Tricyclics       382 Opiates          300 Cocaine          300 THC              50   Pregnancy, urine     Status: None   Collection Time: 07/05/14  4:50 PM  Result Value Ref Range   Preg Test, Ur NEGATIVE NEGATIVE    Comment:        THE SENSITIVITY OF THIS METHODOLOGY IS >20 mIU/mL.    Labs are reviewed and are pertinent for no medical issues.  Current Facility-Administered Medications  Medication Dose Route Frequency Provider Last Rate Last Dose  . [START ON 07/07/2014] citalopram (CELEXA) tablet 40 mg  40 mg Oral Daily Waylan Boga, NP      . cloNIDine (CATAPRES) tablet 0.1 mg  0.1 mg Oral QID Waylan Boga, NP   0.1 mg at 07/06/14 1246   Followed by  . [START ON 07/08/2014] cloNIDine (CATAPRES) tablet 0.1 mg  0.1 mg Oral BH-qamhs Waylan Boga, NP       Followed by  . [START ON 07/10/2014] cloNIDine (CATAPRES) tablet 0.1 mg  0.1 mg Oral QAC breakfast Waylan Boga, NP      . dicyclomine (BENTYL) tablet 20 mg  20 mg Oral Q6H PRN Waylan Boga, NP      . gabapentin (NEURONTIN) capsule 800 mg  800 mg Oral TID Waylan Boga, NP      . gabapentin (NEURONTIN) tablet 800 mg  800 mg Oral TID Nicholaus Bloom, MD      . hydrOXYzine (ATARAX/VISTARIL) tablet 25 mg  25 mg Oral Q6H PRN Waylan Boga, NP      . ibuprofen (ADVIL,MOTRIN) tablet 400 mg  400 mg Oral Q6H PRN Waylan Boga, NP      . loperamide (IMODIUM) capsule 2-4 mg  2-4 mg Oral PRN Waylan Boga, NP      . LORazepam (ATIVAN) tablet 1 mg  1 mg Oral Q6H PRN Waylan Boga, NP   1 mg at 07/06/14 1242  . methocarbamol (ROBAXIN) tablet 500 mg  500 mg Oral Q8H  PRN Waylan Boga, NP      . naproxen (NAPROSYN) tablet 500 mg  500 mg Oral BID PRN Waylan Boga, NP   500 mg at 07/06/14 1245  . nicotine (NICODERM CQ - dosed in mg/24 hours) patch 21 mg  21 mg Transdermal Daily Benjamine Mola, FNP   21 mg at 07/06/14 1242  . ondansetron (ZOFRAN-ODT) disintegrating tablet 4 mg  4 mg Oral Q6H PRN Waylan Boga, NP        Psychiatric Specialty Exam:     Blood pressure 114/75, pulse 82, temperature 98.3 F (36.8 C), temperature source Oral, resp. rate 18, height 5' 4"  (1.626 m), weight 68.04 kg (150 lb), last menstrual period 07/05/2014.Body mass index is 25.73 kg/(m^2).  General Appearance: Casual  Eye Contact::  Good  Speech:  Normal Rate  Volume:  Normal  Mood:  Depressed, low level  Affect:  Congruent  Thought Process:  Coherent  Orientation:  Full (Time, Place, and Person)  Thought Content:  WDL  Suicidal Thoughts:  No  Homicidal Thoughts:  No  Memory:  Immediate;   Good Recent;   Good Remote;   Good  Judgement:  Fair  Insight:  Fair  Psychomotor Activity:  Normal  Concentration:  Good  Recall:  Good  Fund of Knowledge:Good  Language: Good  Akathisia:  No  Handed:  Right  AIMS (if indicated):     Assets:  Leisure Time Physical Health Resilience  Sleep:      Musculoskeletal: Strength & Muscle Tone: within normal limits Gait & Station: normal Patient leans: N/A  Treatment Plan Summary: Clonidine opiate detox protocol -Admit to [facility] today.  Benjamine Mola, FNP-BC 07/06/2014 6:05 PM  Case discussed with me as above

## 2014-09-30 ENCOUNTER — Emergency Department (HOSPITAL_COMMUNITY)
Admission: EM | Admit: 2014-09-30 | Discharge: 2014-10-01 | Disposition: A | Discharge status: Home / Self Care | Payer: Medicaid Other | Attending: Emergency Medicine | Admitting: Emergency Medicine

## 2014-09-30 ENCOUNTER — Encounter (HOSPITAL_COMMUNITY): Payer: Self-pay | Admitting: *Deleted

## 2014-09-30 DIAGNOSIS — R51 Headache: Secondary | ICD-10-CM | POA: Insufficient documentation

## 2014-09-30 DIAGNOSIS — Z79899 Other long term (current) drug therapy: Secondary | ICD-10-CM | POA: Insufficient documentation

## 2014-09-30 DIAGNOSIS — E860 Dehydration: Secondary | ICD-10-CM | POA: Insufficient documentation

## 2014-09-30 DIAGNOSIS — Z720 Tobacco use: Secondary | ICD-10-CM | POA: Insufficient documentation

## 2014-09-30 DIAGNOSIS — N12 Tubulo-interstitial nephritis, not specified as acute or chronic: Secondary | ICD-10-CM | POA: Insufficient documentation

## 2014-09-30 DIAGNOSIS — Z3202 Encounter for pregnancy test, result negative: Secondary | ICD-10-CM | POA: Insufficient documentation

## 2014-09-30 LAB — CBC WITH DIFFERENTIAL/PLATELET
Basophils Absolute: 0 10*3/uL (ref 0.0–0.1)
Basophils Relative: 0 % (ref 0–1)
EOS PCT: 0 % (ref 0–5)
Eosinophils Absolute: 0.1 10*3/uL (ref 0.0–0.7)
HCT: 36.2 % (ref 36.0–46.0)
Hemoglobin: 11.9 g/dL — ABNORMAL LOW (ref 12.0–15.0)
LYMPHS PCT: 7 % — AB (ref 12–46)
Lymphs Abs: 1.1 10*3/uL (ref 0.7–4.0)
MCH: 29 pg (ref 26.0–34.0)
MCHC: 32.9 g/dL (ref 30.0–36.0)
MCV: 88.1 fL (ref 78.0–100.0)
Monocytes Absolute: 1.5 10*3/uL — ABNORMAL HIGH (ref 0.1–1.0)
Monocytes Relative: 9 % (ref 3–12)
Neutro Abs: 14.1 10*3/uL — ABNORMAL HIGH (ref 1.7–7.7)
Neutrophils Relative %: 84 % — ABNORMAL HIGH (ref 43–77)
Platelets: 296 10*3/uL (ref 150–400)
RBC: 4.11 MIL/uL (ref 3.87–5.11)
RDW: 17.6 % — AB (ref 11.5–15.5)
WBC: 16.8 10*3/uL — AB (ref 4.0–10.5)

## 2014-09-30 LAB — URINALYSIS, ROUTINE W REFLEX MICROSCOPIC
BILIRUBIN URINE: NEGATIVE
Glucose, UA: NEGATIVE mg/dL
Ketones, ur: NEGATIVE mg/dL
Nitrite: NEGATIVE
Protein, ur: 30 mg/dL — AB
Specific Gravity, Urine: 1.007 (ref 1.005–1.030)
UROBILINOGEN UA: 1 mg/dL (ref 0.0–1.0)
pH: 6.5 (ref 5.0–8.0)

## 2014-09-30 LAB — URINE MICROSCOPIC-ADD ON

## 2014-09-30 LAB — COMPREHENSIVE METABOLIC PANEL
ALK PHOS: 215 U/L — AB (ref 39–117)
ALT: 296 U/L — ABNORMAL HIGH (ref 0–35)
AST: 85 U/L — ABNORMAL HIGH (ref 0–37)
Albumin: 3 g/dL — ABNORMAL LOW (ref 3.5–5.2)
Anion gap: 10 (ref 5–15)
BILIRUBIN TOTAL: 1.5 mg/dL — AB (ref 0.3–1.2)
BUN: 15 mg/dL (ref 6–23)
CHLORIDE: 99 mmol/L (ref 96–112)
CO2: 25 mmol/L (ref 19–32)
Calcium: 8.5 mg/dL (ref 8.4–10.5)
Creatinine, Ser: 1.21 mg/dL — ABNORMAL HIGH (ref 0.50–1.10)
GFR calc non Af Amer: 60 mL/min — ABNORMAL LOW (ref >90)
GFR, EST AFRICAN AMERICAN: 70 mL/min — AB (ref >90)
GLUCOSE: 126 mg/dL — AB (ref 70–99)
POTASSIUM: 3.4 mmol/L — AB (ref 3.5–5.1)
Sodium: 134 mmol/L — ABNORMAL LOW (ref 135–145)
Total Protein: 7.1 g/dL (ref 6.0–8.3)

## 2014-09-30 LAB — PREGNANCY, URINE: Preg Test, Ur: NEGATIVE

## 2014-09-30 LAB — LIPASE, BLOOD: Lipase: 19 U/L (ref 11–59)

## 2014-09-30 NOTE — ED Notes (Signed)
Pt reports back pain radiating to abdomen associated with nausea. Pt also reports a headache and loss of apatite. Pt reports symptoms for three days.

## 2014-09-30 NOTE — ED Provider Notes (Signed)
CSN: [id number]     Arrival date & time 09/30/14  2116 History  This chart was scribed for Zadie Rhineonald Whitney Hillegass, MD by Bronson CurbJacqueline Melvin, ED Scribe. This patient was seen in room D32C/D32C and the patient's care was started at 12:00 AM.   Chief Complaint  Patient presents with  . Back Pain  . Headache  . Nausea  . Abdominal Pain    Patient is a 29 y.o. female presenting with headaches, abdominal pain, and flank pain. The history is provided by the patient. No language interpreter was used.  Headache Associated symptoms: abdominal pain, back pain, fatigue, nausea and vomiting   Abdominal Pain Associated symptoms: chills, fatigue, nausea and vomiting   Flank Pain This is a new problem. The current episode started more than 2 days ago. The problem has not changed since onset.Associated symptoms include abdominal pain and headaches. Nothing aggravates the symptoms. Nothing relieves the symptoms. She has tried nothing for the symptoms.     HPI Comments: Karen Cline is a 29 y.o. female who presents to the Emergency Department complaining of constant, 10/10, bilateral flank pain that radiates to the abdomen with onset 3 days ago. Patient suspects she has a kidney infection. There is associated chills, nausea, 3 episodes of vomiting, decrease in appetite, malaise. Patient also reports she has been urinating more frequently. She reports past surgical history of cesarean section. She denies diarrhea, fever, cough, SOB, chest pain, LOC, dysuria, or numbness/weakness of the extremities. LNMP last week.   Past Medical History  Diagnosis Date  . Alcohol abuse   . Polysubstance abuse     Xanax, Heroin   Past Surgical History  Procedure Laterality Date  . Ankle fracture surgery      history restored after error with record merge       History reviewed. No pertinent family history. History  Substance Use Topics  . Smoking status: Current Every Day Smoker -- 0.50 packs/day    Types: Cigarettes   . Smokeless tobacco: Not on file  . Alcohol Use: Yes     Comment: ETOH abuse   OB History    No data available     Review of Systems  Constitutional: Positive for chills, appetite change (decrease) and fatigue.  Gastrointestinal: Positive for nausea, vomiting and abdominal pain.  Genitourinary: Positive for frequency and flank pain.  Musculoskeletal: Positive for back pain.  Neurological: Positive for headaches.  All other systems reviewed and are negative.     Allergies  Tramadol and Ultram  Home Medications   Prior to Admission medications   Medication Sig Start Date End Date Taking? Authorizing Provider  citalopram (CELEXA) 40 MG tablet Take 1 tablet (40 mg total) by mouth daily. 07/06/14   Rachael FeeIrving A Lugo, MD  gabapentin (NEURONTIN) 400 MG capsule Take 2 capsules (800 mg total) by mouth 3 (three) times daily. 07/06/14   Rachael FeeIrving A Lugo, MD   Triage Vitals: BP 112/65 mmHg  Pulse 99  Temp(Src) 98.2 F (36.8 C)  Resp 20  SpO2 100%  LMP 09/23/2014 (Approximate)  Physical Exam  Nursing note and vitals reviewed. CONSTITUTIONAL: Well developed/well nourished HEAD: Normocephalic/atraumatic EYES: EOMI/PERRL ENMT: Mucous membranes moist NECK: supple no meningeal signs SPINE/BACK:entire spine nontender CV: S1/S2 noted, no murmurs/rubs/gallops noted LUNGS: Lungs are clear to auscultation bilaterally, no apparent distress ABDOMEN: soft, nontender, no rebound or guarding, bowel sounds noted throughout abdomen [id number]:right cva tenderness NEURO: Pt is awake/alert/appropriate, moves all extremitiesx4.  No facial droop.  She has appropriate motor strength  of bilateral LE.   EXTREMITIES: pulses normal/equal, full ROM SKIN: warm, color normal PSYCH: no abnormalities of mood noted, alert and oriented to situation   ED Course  Procedures   DIAGNOSTIC STUDIES: Oxygen Saturation is 100% on room air, normal by my interpretation.    COORDINATION OF CARE: At 0009 Discussed treatment  plan with patient which includes antiemetic, pain medication, ABX. Patient agrees.   1:14 AM Pt improved She is taking PO She is not septic appearing Given UTI, flank pain will treat for pyelo She is well appearing and feels well for d/c home   Labs Review Labs Reviewed  CBC WITH DIFFERENTIAL/PLATELET - Abnormal; Notable for the following:    WBC 16.8 (*)    Hemoglobin 11.9 (*)    RDW 17.6 (*)    Neutrophils Relative % 84 (*)    Neutro Abs 14.1 (*)    Lymphocytes Relative 7 (*)    Monocytes Absolute 1.5 (*)    All other components within normal limits  COMPREHENSIVE METABOLIC PANEL - Abnormal; Notable for the following:    Sodium 134 (*)    Potassium 3.4 (*)    Glucose, Bld 126 (*)    Creatinine, Ser 1.21 (*)    Albumin 3.0 (*)    AST 85 (*)    ALT 296 (*)    Alkaline Phosphatase 215 (*)    Total Bilirubin 1.5 (*)    GFR calc non Af Amer 60 (*)    GFR calc Af Amer 70 (*)    All other components within normal limits  URINALYSIS, ROUTINE W REFLEX MICROSCOPIC - Abnormal; Notable for the following:    APPearance CLOUDY (*)    Hgb urine dipstick LARGE (*)    Protein, ur 30 (*)    Leukocytes, UA MODERATE (*)    All other components within normal limits  URINE MICROSCOPIC-ADD ON - Abnormal; Notable for the following:    Squamous Epithelial / LPF FEW (*)    Bacteria, UA FEW (*)    All other components within normal limits  LIPASE, BLOOD  PREGNANCY, URINE    Medications  ondansetron (ZOFRAN-ODT) disintegrating tablet 8 mg (8 mg Oral Given 10/01/14 0017)  cefTRIAXone (ROCEPHIN) injection 1 g (1 g Intramuscular Given 10/01/14 0017)  oxyCODONE-acetaminophen (PERCOCET/ROXICET) 5-325 MG per tablet 2 tablet (2 tablets Oral Given 10/01/14 0017)     MDM   Final diagnoses:  Pyelonephritis  Dehydration    Nursing notes including past medical history and social history reviewed and considered in documentation Labs/vital reviewed myself and considered during evaluation    I  personally performed the services described in this documentation, which was scribed in my presence. The recorded information has been reviewed and is accurate.       Zadie Rhine, MD 10/01/14 [phone]

## 2014-10-01 MED ORDER — ONDANSETRON 8 MG PO TBDP: 8.0000 mg | ORAL_TABLET | Freq: Three times a day (TID) | ORAL | Status: DC | PRN

## 2014-10-01 MED ORDER — OXYCODONE-ACETAMINOPHEN 5-325 MG PO TABS
2.0000 | ORAL_TABLET | Freq: Once | ORAL | Status: AC
  Administered 2014-10-01: 2 via ORAL
  Filled 2014-10-01: qty 2

## 2014-10-01 MED ORDER — CEFTRIAXONE SODIUM 1 G IJ SOLR
1.0000 g | Freq: Once | INTRAMUSCULAR | Status: AC
  Administered 2014-10-01: 1 g via INTRAMUSCULAR
  Filled 2014-10-01: qty 10

## 2014-10-01 MED ORDER — ONDANSETRON 4 MG PO TBDP
8.0000 mg | ORAL_TABLET | Freq: Once | ORAL | Status: AC
  Administered 2014-10-01: 8 mg via ORAL
  Filled 2014-10-01: qty 2

## 2014-10-01 MED ORDER — CEPHALEXIN 500 MG PO CAPS: 500.0000 mg | ORAL_CAPSULE | Freq: Four times a day (QID) | ORAL | Status: DC

## 2014-10-01 NOTE — ED Notes (Signed)
Gave pt sandwich, per RN

## 2014-10-01 NOTE — ED Notes (Signed)
MD Wickline at bedside. 

## 2015-01-04 ENCOUNTER — Emergency Department (HOSPITAL_COMMUNITY): Payer: Medicaid Other

## 2015-01-04 ENCOUNTER — Emergency Department (HOSPITAL_COMMUNITY)
Admission: EM | Admit: 2015-01-04 | Discharge: 2015-01-04 | Disposition: A | Discharge status: Home / Self Care | Payer: Self-pay | Attending: Emergency Medicine | Admitting: Emergency Medicine

## 2015-01-04 ENCOUNTER — Encounter (HOSPITAL_COMMUNITY): Payer: Self-pay | Admitting: Emergency Medicine

## 2015-01-04 DIAGNOSIS — Z79899 Other long term (current) drug therapy: Secondary | ICD-10-CM | POA: Insufficient documentation

## 2015-01-04 DIAGNOSIS — S42035A Nondisplaced fracture of lateral end of left clavicle, initial encounter for closed fracture: Secondary | ICD-10-CM | POA: Insufficient documentation

## 2015-01-04 DIAGNOSIS — Z720 Tobacco use: Secondary | ICD-10-CM | POA: Insufficient documentation

## 2015-01-04 DIAGNOSIS — F1210 Cannabis abuse, uncomplicated: Secondary | ICD-10-CM | POA: Insufficient documentation

## 2015-01-04 DIAGNOSIS — Y998 Other external cause status: Secondary | ICD-10-CM | POA: Insufficient documentation

## 2015-01-04 DIAGNOSIS — F10929 Alcohol use, unspecified with intoxication, unspecified: Secondary | ICD-10-CM

## 2015-01-04 DIAGNOSIS — F1410 Cocaine abuse, uncomplicated: Secondary | ICD-10-CM | POA: Insufficient documentation

## 2015-01-04 DIAGNOSIS — W1839XA Other fall on same level, initial encounter: Secondary | ICD-10-CM | POA: Insufficient documentation

## 2015-01-04 DIAGNOSIS — S42031A Displaced fracture of lateral end of right clavicle, initial encounter for closed fracture: Secondary | ICD-10-CM

## 2015-01-04 DIAGNOSIS — F911 Conduct disorder, childhood-onset type: Secondary | ICD-10-CM | POA: Insufficient documentation

## 2015-01-04 DIAGNOSIS — F10120 Alcohol abuse with intoxication, uncomplicated: Secondary | ICD-10-CM | POA: Insufficient documentation

## 2015-01-04 DIAGNOSIS — Y9289 Other specified places as the place of occurrence of the external cause: Secondary | ICD-10-CM | POA: Insufficient documentation

## 2015-01-04 DIAGNOSIS — Z3202 Encounter for pregnancy test, result negative: Secondary | ICD-10-CM | POA: Insufficient documentation

## 2015-01-04 DIAGNOSIS — R4689 Other symptoms and signs involving appearance and behavior: Secondary | ICD-10-CM | POA: Insufficient documentation

## 2015-01-04 DIAGNOSIS — W19XXXA Unspecified fall, initial encounter: Secondary | ICD-10-CM

## 2015-01-04 DIAGNOSIS — Y9389 Activity, other specified: Secondary | ICD-10-CM | POA: Insufficient documentation

## 2015-01-04 LAB — RAPID URINE DRUG SCREEN, HOSP PERFORMED
AMPHETAMINES: NOT DETECTED
BARBITURATES: NOT DETECTED
Benzodiazepines: NOT DETECTED
Cocaine: POSITIVE — AB
Opiates: NOT DETECTED
TETRAHYDROCANNABINOL: POSITIVE — AB

## 2015-01-04 LAB — URINALYSIS, ROUTINE W REFLEX MICROSCOPIC
Bilirubin Urine: NEGATIVE
Glucose, UA: NEGATIVE mg/dL
Hgb urine dipstick: NEGATIVE
Ketones, ur: NEGATIVE mg/dL
Leukocytes, UA: NEGATIVE
NITRITE: NEGATIVE
PH: 6 (ref 5.0–8.0)
Protein, ur: NEGATIVE mg/dL
SPECIFIC GRAVITY, URINE: 1.004 — AB (ref 1.005–1.030)
Urobilinogen, UA: 0.2 mg/dL (ref 0.0–1.0)

## 2015-01-04 LAB — CBC WITH DIFFERENTIAL/PLATELET
BASOS PCT: 1 % (ref 0–1)
Basophils Absolute: 0 10*3/uL (ref 0.0–0.1)
EOS PCT: 1 % (ref 0–5)
Eosinophils Absolute: 0.1 10*3/uL (ref 0.0–0.7)
HCT: 36.5 % (ref 36.0–46.0)
Hemoglobin: 11.9 g/dL — ABNORMAL LOW (ref 12.0–15.0)
Lymphocytes Relative: 56 % — ABNORMAL HIGH (ref 12–46)
Lymphs Abs: 3.4 10*3/uL (ref 0.7–4.0)
MCH: 30.7 pg (ref 26.0–34.0)
MCHC: 32.6 g/dL (ref 30.0–36.0)
MCV: 94.3 fL (ref 78.0–100.0)
Monocytes Absolute: 0.4 10*3/uL (ref 0.1–1.0)
Monocytes Relative: 6 % (ref 3–12)
NEUTROS PCT: 36 % — AB (ref 43–77)
Neutro Abs: 2.2 10*3/uL (ref 1.7–7.7)
Platelets: 270 10*3/uL (ref 150–400)
RBC: 3.87 MIL/uL (ref 3.87–5.11)
RDW: 15.8 % — ABNORMAL HIGH (ref 11.5–15.5)
WBC: 6.1 10*3/uL (ref 4.0–10.5)

## 2015-01-04 LAB — BASIC METABOLIC PANEL
ANION GAP: 9 (ref 5–15)
BUN: 8 mg/dL (ref 6–20)
CALCIUM: 8.5 mg/dL — AB (ref 8.9–10.3)
CO2: 28 mmol/L (ref 22–32)
Chloride: 108 mmol/L (ref 101–111)
Creatinine, Ser: 0.92 mg/dL (ref 0.44–1.00)
GFR calc Af Amer: >60 mL/min (ref >60)
Glucose, Bld: 94 mg/dL (ref 65–99)
Potassium: 3.3 mmol/L — ABNORMAL LOW (ref 3.5–5.1)
Sodium: 145 mmol/L (ref 135–145)

## 2015-01-04 LAB — SALICYLATE LEVEL

## 2015-01-04 LAB — CBG MONITORING, ED: GLUCOSE-CAPILLARY: 107 mg/dL — AB (ref 65–99)

## 2015-01-04 LAB — POC URINE PREG, ED: Preg Test, Ur: NEGATIVE

## 2015-01-04 LAB — ETHANOL: Alcohol, Ethyl (B): 264 mg/dL — ABNORMAL HIGH (ref <5)

## 2015-01-04 LAB — ACETAMINOPHEN LEVEL: Acetaminophen (Tylenol), Serum: <10 ug/mL — ABNORMAL LOW (ref 10–30)

## 2015-01-04 MED ORDER — LORAZEPAM 2 MG/ML IJ SOLN
INTRAMUSCULAR | Status: AC
  Administered 2015-01-04: 2 mg via INTRAMUSCULAR
  Filled 2015-01-04: qty 1

## 2015-01-04 MED ORDER — LORAZEPAM 2 MG/ML IJ SOLN
2.0000 mg | Freq: Once | INTRAMUSCULAR | Status: AC
  Administered 2015-01-04: 2 mg via INTRAMUSCULAR

## 2015-01-04 MED ORDER — HALOPERIDOL LACTATE 5 MG/ML IJ SOLN
5.0000 mg | Freq: Once | INTRAMUSCULAR | Status: AC
  Administered 2015-01-04: 5 mg via INTRAMUSCULAR

## 2015-01-04 MED ORDER — SODIUM CHLORIDE 0.9 % IV BOLUS (SEPSIS)
1000.0000 mL | Freq: Once | INTRAVENOUS | Status: AC
Start: 2015-01-04 — End: 2015-01-04
  Administered 2015-01-04: 1000 mL via INTRAVENOUS

## 2015-01-04 MED ORDER — ACETAMINOPHEN 325 MG PO TABS
650.0000 mg | ORAL_TABLET | Freq: Once | ORAL | Status: AC
  Administered 2015-01-04: 650 mg via ORAL
  Filled 2015-01-04: qty 2

## 2015-01-04 MED ORDER — HALOPERIDOL LACTATE 5 MG/ML IJ SOLN
INTRAMUSCULAR | Status: AC
  Administered 2015-01-04: 5 mg via INTRAMUSCULAR
  Filled 2015-01-04: qty 1

## 2015-01-04 NOTE — ED Notes (Signed)
Please call [employer] upon d/c as pt has outstanding warrants for assault on EMS personal.

## 2015-01-04 NOTE — ED Notes (Signed)
Per EMS, patient picked up at a gas station. Patient had stolen beer from the gas station and was found behind it drinking the beer. Patient has been aggressive with EMS, spitting on EMS personnel, screaming, and cursing. Patient was restrained by EMS en route. Patient c/o right shoulder pain with EMS. HR 116, unable to obtain BP due to patient behavior. Patient has been given 5mg  Haldol IM by EMS.

## 2015-01-04 NOTE — Discharge Instructions (Signed)
Alcohol Intoxication Alcohol intoxication occurs when the amount of alcohol that a person has consumed impairs his or her ability to mentally and physically function. Alcohol directly impairs the normal chemical activity of the brain. Drinking large amounts of alcohol can lead to changes in mental function and behavior, and it can cause many physical effects that can be harmful.  Alcohol intoxication can range in severity from mild to very severe. Various factors can affect the level of intoxication that occurs, such as the person's age, gender, weight, frequency of alcohol consumption, and the presence of other medical conditions (such as diabetes, seizures, or heart conditions). Dangerous levels of alcohol intoxication may occur when people drink large amounts of alcohol in a short period (binge drinking). Alcohol can also be especially dangerous when combined with certain prescription medicines or "recreational" drugs. SIGNS AND SYMPTOMS Some common signs and symptoms of mild alcohol intoxication include:  Loss of coordination.  Changes in mood and behavior.  Impaired judgment.  Slurred speech. As alcohol intoxication progresses to more severe levels, other signs and symptoms will appear. These may include:  Vomiting.  Confusion and impaired memory.  Slowed breathing.  Seizures.  Loss of consciousness. DIAGNOSIS  Your health care provider will take a medical history and perform a physical exam. You will be asked about the amount and type of alcohol you have consumed. Blood tests will be done to measure the concentration of alcohol in your blood. In many places, your blood alcohol level must be lower than 80 mg/dL (1.61%) to legally drive. However, many dangerous effects of alcohol can occur at much lower levels.  TREATMENT  People with alcohol intoxication often do not require treatment. Most of the effects of alcohol intoxication are temporary, and they go away as the alcohol naturally  leaves the body. Your health care provider will monitor your condition until you are stable enough to go home. Fluids are sometimes given through an IV access tube to help prevent dehydration.  HOME CARE INSTRUCTIONS  Do not drive after drinking alcohol.  Stay hydrated. Drink enough water and fluids to keep your urine clear or pale yellow. Avoid caffeine.   Only take over-the-counter or prescription medicines as directed by your health care provider.  SEEK MEDICAL CARE IF:   You have persistent vomiting.   You do not feel better after a few days.  You have frequent alcohol intoxication. Your health care provider can help determine if you should see a substance use treatment counselor. SEEK IMMEDIATE MEDICAL CARE IF:   You become shaky or tremble when you try to stop drinking.   You shake uncontrollably (seizure).   You throw up (vomit) blood. This may be bright red or may look like black coffee grounds.   You have blood in your stool. This may be bright red or may appear as a black, tarry, bad smelling stool.   You become lightheaded or faint.  MAKE SURE YOU:   Understand these instructions.  Will watch your condition.  Will get help right away if you are not doing well or get worse. Document Released: 04/12/2005 Document Revised: 03/05/2013 Document Reviewed: 12/06/2012 [facility] Patient Information 2015 [place], [place]. This information is not intended to replace advice given to you by your health care provider. Make sure you discuss any questions you have with your health care provider.  Clavicle Fracture The clavicle, also called the collarbone, is the long bone that connects your shoulder to your rib cage. You can feel your collarbone at  the top of your shoulders and rib cage. A clavicle fracture is a broken clavicle. It is a common injury that can happen at any age.  CAUSES Common causes of a clavicle fracture include:  A direct blow to your shoulder.  A car  accident.  A fall, especially if you try to break your fall with an outstretched arm. RISK FACTORS You may be at increased risk if:  You are younger than 25 years or older than 75 years. Most clavicle fractures happen to people who are younger than 25 years.  You are a female.  You play contact sports. SIGNS AND SYMPTOMS A fractured clavicle is painful. It also makes it hard to move your arm. Other signs and symptoms may include:  A shoulder that drops downward and forward.  Pain when trying to lift your shoulder.  Bruising, swelling, and tenderness over your clavicle.  A grinding noise when you try to move your shoulder.  A bump over your clavicle. DIAGNOSIS Your health care provider can usually diagnose a clavicle fracture by asking about your injury and examining your shoulder and clavicle. He or she may take an X-ray to determine the position of your clavicle. TREATMENT Treatment depends on the position of your clavicle after the fracture:  If the broken ends of the bone are not out of place, your health care provider may put your arm in a sling or wrap a support bandage around your chest (figure-of-eight wrap).  If the broken ends of the bone are out of place, you may need surgery. Surgery may involve placing screws, pins, or plates to keep your clavicle stable while it heals. Healing may take about 3 months. When your health care provider thinks your fracture has healed enough, you may have to do physical therapy to regain normal movement and build up your arm strength. HOME CARE INSTRUCTIONS   Apply ice to the injured area:  Put ice in a plastic bag.  Place a towel between your skin and the bag.  Leave the ice on for 20 minutes, 2-3 times a day.  If you have a wrap or splint:  Wear it all the time, and remove it only to take a bath or shower.  When you bathe or shower, keep your shoulder in the same position as when the sling or wrap is on.  Do not lift your  arm.  If you have a figure-of-eight wrap:  Another person must tighten it every day.  It should be tight enough to hold your shoulders back.  Allow enough room to place your index finger between your body and the strap.  Loosen the wrap immediately if you feel numbness or tingling in your hands.  Only take medicines as directed by your health care provider.  Avoid activities that make the injury or pain worse for 4-6 weeks after surgery.  Keep all follow-up appointments. SEEK MEDICAL CARE IF:  Your medicine is not helping to relieve pain and swelling. SEEK IMMEDIATE MEDICAL CARE IF:  Your arm is numb, cold, or pale, even when the splint is loose. MAKE SURE YOU:   Understand these instructions.  Will watch your condition.  Will get help right away if you are not doing well or get worse. Document Released: 04/12/2005 Document Revised: 07/08/2013 Document Reviewed: 05/26/2013 [facility] Patient Information 2015 [place], [place]. This information is not intended to replace advice given to you by your health care provider. Make sure you discuss any questions you have with your health care  provider.

## 2015-01-04 NOTE — ED Notes (Signed)
Patient ambulated to bathroom, up eating at bedside.

## 2015-01-04 NOTE — ED Notes (Signed)
Patient shirt and bra cut from her. Additional clothes removed. All belongings placed in a bag at bedside with patient label.
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
## 2015-01-04 NOTE — ED Notes (Signed)
md at bedside

## 2015-01-04 NOTE — ED Notes (Signed)
Unable to complete screening questions due to patient behavior at this time.

## 2015-03-11 ENCOUNTER — Emergency Department (HOSPITAL_COMMUNITY): Payer: Medicaid Other

## 2015-03-11 ENCOUNTER — Encounter (HOSPITAL_COMMUNITY): Payer: Self-pay | Admitting: Family Medicine

## 2015-03-11 ENCOUNTER — Emergency Department (HOSPITAL_COMMUNITY)
Admission: EM | Admit: 2015-03-11 | Discharge: 2015-03-11 | Disposition: A | Discharge status: Home / Self Care | Payer: Medicaid Other | Attending: Emergency Medicine | Admitting: Emergency Medicine

## 2015-03-11 DIAGNOSIS — Z720 Tobacco use: Secondary | ICD-10-CM | POA: Insufficient documentation

## 2015-03-11 DIAGNOSIS — M546 Pain in thoracic spine: Secondary | ICD-10-CM | POA: Insufficient documentation

## 2015-03-11 DIAGNOSIS — M545 Low back pain, unspecified: Secondary | ICD-10-CM

## 2015-03-11 DIAGNOSIS — R51 Headache: Secondary | ICD-10-CM | POA: Insufficient documentation

## 2015-03-11 DIAGNOSIS — Z79899 Other long term (current) drug therapy: Secondary | ICD-10-CM | POA: Insufficient documentation

## 2015-03-11 DIAGNOSIS — R110 Nausea: Secondary | ICD-10-CM | POA: Insufficient documentation

## 2015-03-11 DIAGNOSIS — R05 Cough: Secondary | ICD-10-CM | POA: Insufficient documentation

## 2015-03-11 DIAGNOSIS — R509 Fever, unspecified: Secondary | ICD-10-CM | POA: Insufficient documentation

## 2015-03-11 DIAGNOSIS — R630 Anorexia: Secondary | ICD-10-CM | POA: Insufficient documentation

## 2015-03-11 LAB — URINALYSIS, ROUTINE W REFLEX MICROSCOPIC
Bilirubin Urine: NEGATIVE
GLUCOSE, UA: NEGATIVE mg/dL
Hgb urine dipstick: NEGATIVE
Ketones, ur: NEGATIVE mg/dL
LEUKOCYTES UA: NEGATIVE
Nitrite: NEGATIVE
PH: 6.5 (ref 5.0–8.0)
PROTEIN: NEGATIVE mg/dL
SPECIFIC GRAVITY, URINE: 1.006 (ref 1.005–1.030)
Urobilinogen, UA: 0.2 mg/dL (ref 0.0–1.0)

## 2015-03-11 MED ORDER — OXYCODONE-ACETAMINOPHEN 5-325 MG PO TABS: 1.0000 | ORAL_TABLET | ORAL | Status: DC | PRN

## 2015-03-11 MED ORDER — IBUPROFEN 800 MG PO TABS: 800.0000 mg | ORAL_TABLET | Freq: Three times a day (TID) | ORAL | Status: DC

## 2015-03-11 MED ORDER — CYCLOBENZAPRINE HCL 10 MG PO TABS: 10.0000 mg | ORAL_TABLET | Freq: Two times a day (BID) | ORAL | Status: DC | PRN

## 2015-03-11 MED ORDER — HYDROCODONE-ACETAMINOPHEN 5-325 MG PO TABS
2.0000 | ORAL_TABLET | Freq: Once | ORAL | Status: AC
  Administered 2015-03-11: 2 via ORAL
  Filled 2015-03-11: qty 2

## 2015-03-11 NOTE — Discharge Instructions (Signed)
Back Pain, Adult °Back pain is very common. The pain often gets better over time. The cause of back pain is usually not dangerous. Most people can learn to manage their back pain on their own.  °HOME CARE  °· Stay active. Start with short walks on flat ground if you can. Try to walk farther each day. °· Do not sit, drive, or stand in one place for more than 30 minutes. Do not stay in bed. °· Do not avoid exercise or work. Activity can help your back heal faster. °· Be careful when you bend or lift an object. Bend at your knees, keep the object close to you, and do not twist. °· Sleep on a firm mattress. Lie on your side, and bend your knees. If you lie on your back, put a pillow under your knees. °· Only take medicines as told by your doctor. °· Put ice on the injured area. °¨ Put ice in a plastic bag. °¨ Place a towel between your skin and the bag. °¨ Leave the ice on for 15-20 minutes, 03-04 times a day for the first 2 to 3 days. After that, you can switch between ice and heat packs. °· Ask your doctor about back exercises or massage. °· Avoid feeling anxious or stressed. Find good ways to deal with stress, such as exercise. °GET HELP RIGHT AWAY IF:  °· Your pain does not go away with rest or medicine. °· Your pain does not go away in 1 week. °· You have new problems. °· You do not feel well. °· The pain spreads into your legs. °· You cannot control when you poop (bowel movement) or pee (urinate). °· Your arms or legs feel weak or lose feeling (numbness). °· You feel sick to your stomach (nauseous) or throw up (vomit). °· You have belly (abdominal) pain. °· You feel like you may pass out (faint). °MAKE SURE YOU:  °· Understand these instructions. °· Will watch your condition. °· Will get help right away if you are not doing well or get worse. °Document Released: 12/20/2007 Document Revised: 09/25/2011 Document Reviewed: 11/04/2013 °[employer]® Patient Information ©2015 [employer]. This information is not intended  to replace advice given to you by your health care provider. Make sure you discuss any questions you have with your health care provider. ° °

## 2015-03-11 NOTE — ED Notes (Signed)
Pt given crackers and Coke. 

## 2015-03-11 NOTE — ED Notes (Signed)
Pt here for mid back pain for a few days. sts hurts when she bends over and coughs.
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
## 2015-03-15 ENCOUNTER — Emergency Department (HOSPITAL_COMMUNITY): Payer: Medicaid Other

## 2015-03-15 ENCOUNTER — Encounter (HOSPITAL_COMMUNITY): Payer: Self-pay | Admitting: *Deleted

## 2015-03-15 ENCOUNTER — Emergency Department (HOSPITAL_COMMUNITY)
Admission: EM | Admit: 2015-03-15 | Discharge: 2015-03-15 | Disposition: A | Discharge status: Home / Self Care | Payer: Medicaid Other | Attending: Emergency Medicine | Admitting: Emergency Medicine

## 2015-03-15 DIAGNOSIS — Z79899 Other long term (current) drug therapy: Secondary | ICD-10-CM | POA: Insufficient documentation

## 2015-03-15 DIAGNOSIS — Y9389 Activity, other specified: Secondary | ICD-10-CM | POA: Insufficient documentation

## 2015-03-15 DIAGNOSIS — S52502A Unspecified fracture of the lower end of left radius, initial encounter for closed fracture: Secondary | ICD-10-CM | POA: Insufficient documentation

## 2015-03-15 DIAGNOSIS — Y9289 Other specified places as the place of occurrence of the external cause: Secondary | ICD-10-CM | POA: Insufficient documentation

## 2015-03-15 DIAGNOSIS — Z720 Tobacco use: Secondary | ICD-10-CM | POA: Insufficient documentation

## 2015-03-15 DIAGNOSIS — Y998 Other external cause status: Secondary | ICD-10-CM | POA: Insufficient documentation

## 2015-03-15 MED ORDER — HYDROCODONE-ACETAMINOPHEN 5-325 MG PO TABS: 1.0000 | ORAL_TABLET | ORAL | Status: DC | PRN

## 2015-03-15 MED ORDER — IBUPROFEN 800 MG PO TABS
800.0000 mg | ORAL_TABLET | Freq: Once | ORAL | Status: AC
  Administered 2015-03-15: 800 mg via ORAL
  Filled 2015-03-15: qty 1

## 2015-03-15 NOTE — ED Notes (Signed)
Pt ate breakfast without difficulty. Pt attempting to find a ride home.
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
## 2015-03-15 NOTE — Discharge Instructions (Signed)
Wrist Fracture A wrist fracture is a break or crack in one of the bones of your wrist. Your wrist is made up of eight small bones at the palm of your hand (carpal bones) and two long bones that make up your forearm (radius and ulna).  CAUSES   A direct blow to the wrist.  Falling on an outstretched hand.  Trauma, such as a car accident or a fall. RISK FACTORS Risk factors for wrist fracture include:   Participating in contact and high-risk sports, such as skiing, biking, and ice skating.  Taking steroid medicines.  Smoking.  Being female.  Being Caucasian.  Drinking more than three alcoholic beverages per day.  Having low or lowered bone density (osteoporosis or osteopenia).  Age. Older adults have decreased bone density.  Women who have had menopause.  History of previous fractures. SIGNS AND SYMPTOMS Symptoms of wrist fractures include tenderness, bruising, and inflammation. Additionally, the wrist may hang in an odd position or appear deformed.  DIAGNOSIS Diagnosis may include:  Physical exam.  X-ray. TREATMENT Treatment depends on many factors, including the nature and location of the fracture, your age, and your activity level. Treatment for wrist fracture can be nonsurgical or surgical.  Nonsurgical Treatment A plaster cast or splint may be applied to your wrist if the bone is in a good position. If the fracture is not in good position, it may be necessary for your health care provider to realign it before applying a splint or cast. Usually, a cast or splint will be worn for several weeks.  Surgical Treatment Sometimes the position of the bone is so far out of place that surgery is required to apply a device to hold it together as it heals. Depending on the fracture, there are a number of options for holding the bone in place while it heals, such as a cast and metal pins.  HOME CARE INSTRUCTIONS  Keep your injured wrist elevated and move your fingers as much as  possible.  Do not put pressure on any part of your cast or splint. It may break.   Use a plastic bag to protect your cast or splint from water while bathing or showering. Do not lower your cast or splint into water.  Take medicines only as directed by your health care provider.  Keep your cast or splint clean and dry. If it becomes wet, damaged, or suddenly feels too tight, contact your health care provider right away.  Do not use any tobacco products including cigarettes, chewing tobacco, or electronic cigarettes. Tobacco can delay bone healing. If you need help quitting, ask your health care provider.  Keep all follow-up visits as directed by your health care provider. This is important.  Ask your health care provider if you should take supplements of calcium and vitamins C and D to promote bone healing. SEEK MEDICAL CARE IF:   Your cast or splint is damaged, breaks, or gets wet.  You have a fever.  You have chills.  You have continued severe pain or more swelling than you did before the cast was put on. SEEK IMMEDIATE MEDICAL CARE IF:   Your hand or fingernails on the injured arm turn blue or gray, or feel cold or numb.  You have decreased feeling in the fingers of your injured arm. MAKE SURE YOU:  Understand these instructions.  Will watch your condition.  Will get help right away if you are not doing well or get worse. Document Released: 04/12/2005 Document Revised:   11/17/2013 Document Reviewed: 07/21/2011 Exit[employer] Patient Information 2015 [employer]. This information is not intended to replace advice given to you by your health care provider. Make sure you discuss any questions you have with your health care provider.  

## 2015-03-15 NOTE — ED Notes (Addendum)
Pt sleeping, respirations normal. Pt refused BP recheck

## 2015-03-15 NOTE — ED Notes (Signed)
Pt is in stable condition upon d/c and is escorted from ED via wheelchair. 

## 2015-03-15 NOTE — Progress Notes (Signed)
Orthopedic Tech Progress Note Patient Details:  Karen Cline 1985/09/28 [id number] Patient sleep throughout application. Arm sling left at bedside for application by nursing once patient is ready for discharge.  Ortho Devices Type of Ortho Device: Ace wrap, Arm sling, Sugartong splint Ortho Device/Splint Location: LUE Ortho Device/Splint Interventions: Application   Asia R Thompson 03/15/2015, 4:54 AM

## 2015-03-17 ENCOUNTER — Inpatient Hospital Stay (HOSPITAL_COMMUNITY): Payer: Self-pay | Admitting: Anesthesiology

## 2015-03-17 ENCOUNTER — Ambulatory Visit (HOSPITAL_COMMUNITY)
Admission: RE | Admit: 2015-03-17 | Discharge: 2015-03-17 | Disposition: A | Discharge status: Home / Self Care | Payer: Self-pay | Source: Ambulatory Visit | Attending: Orthopedic Surgery | Admitting: Orthopedic Surgery

## 2015-03-17 ENCOUNTER — Ambulatory Visit (HOSPITAL_COMMUNITY): Payer: Medicaid Other

## 2015-03-17 ENCOUNTER — Encounter (HOSPITAL_COMMUNITY): Payer: Self-pay | Admitting: *Deleted

## 2015-03-17 ENCOUNTER — Encounter (HOSPITAL_COMMUNITY)
Admission: RE | Disposition: A | Discharge status: Home / Self Care | Payer: Self-pay | Source: Ambulatory Visit | Attending: Orthopedic Surgery

## 2015-03-17 DIAGNOSIS — Z79899 Other long term (current) drug therapy: Secondary | ICD-10-CM | POA: Insufficient documentation

## 2015-03-17 DIAGNOSIS — Y9383 Activity, rough housing and horseplay: Secondary | ICD-10-CM | POA: Insufficient documentation

## 2015-03-17 DIAGNOSIS — K219 Gastro-esophageal reflux disease without esophagitis: Secondary | ICD-10-CM | POA: Insufficient documentation

## 2015-03-17 DIAGNOSIS — Z419 Encounter for procedure for purposes other than remedying health state, unspecified: Secondary | ICD-10-CM

## 2015-03-17 DIAGNOSIS — S52572A Other intraarticular fracture of lower end of left radius, initial encounter for closed fracture: Secondary | ICD-10-CM | POA: Insufficient documentation

## 2015-03-17 DIAGNOSIS — F17210 Nicotine dependence, cigarettes, uncomplicated: Secondary | ICD-10-CM | POA: Insufficient documentation

## 2015-03-17 DIAGNOSIS — S52612A Displaced fracture of left ulna styloid process, initial encounter for closed fracture: Secondary | ICD-10-CM | POA: Insufficient documentation

## 2015-03-17 DIAGNOSIS — X58XXXA Exposure to other specified factors, initial encounter: Secondary | ICD-10-CM | POA: Insufficient documentation

## 2015-03-17 HISTORY — DX: Myoneural disorder, unspecified: G70.9

## 2015-03-17 HISTORY — DX: Gastro-esophageal reflux disease without esophagitis: K21.9

## 2015-03-17 HISTORY — PX: OPEN REDUCTION INTERNAL FIXATION (ORIF) DISTAL RADIAL FRACTURE: SHX5989

## 2015-03-17 HISTORY — DX: Unspecified convulsions: R56.9

## 2015-03-17 LAB — CBC
HCT: 39.8 % (ref 36.0–46.0)
HEMOGLOBIN: 12.9 g/dL (ref 12.0–15.0)
MCH: 31.2 pg (ref 26.0–34.0)
MCHC: 32.4 g/dL (ref 30.0–36.0)
MCV: 96.4 fL (ref 78.0–100.0)
PLATELETS: 282 10*3/uL (ref 150–400)
RBC: 4.13 MIL/uL (ref 3.87–5.11)
RDW: 14 % (ref 11.5–15.5)
WBC: 7.4 10*3/uL (ref 4.0–10.5)

## 2015-03-17 LAB — COMPREHENSIVE METABOLIC PANEL
ALBUMIN: 3.9 g/dL (ref 3.5–5.0)
ALK PHOS: 96 U/L (ref 38–126)
ALT: 30 U/L (ref 14–54)
ANION GAP: 9 (ref 5–15)
AST: 27 U/L (ref 15–41)
BUN: 5 mg/dL — ABNORMAL LOW (ref 6–20)
CALCIUM: 9.7 mg/dL (ref 8.9–10.3)
CHLORIDE: 104 mmol/L (ref 101–111)
CO2: 27 mmol/L (ref 22–32)
CREATININE: 0.87 mg/dL (ref 0.44–1.00)
GFR calc Af Amer: >60 mL/min (ref >60)
GFR calc non Af Amer: >60 mL/min (ref >60)
GLUCOSE: 89 mg/dL (ref 65–99)
Potassium: 4 mmol/L (ref 3.5–5.1)
SODIUM: 140 mmol/L (ref 135–145)
Total Bilirubin: 0.8 mg/dL (ref 0.3–1.2)
Total Protein: 7.6 g/dL (ref 6.5–8.1)

## 2015-03-17 LAB — HCG, SERUM, QUALITATIVE: PREG SERUM: NEGATIVE

## 2015-03-17 SURGERY — OPEN REDUCTION INTERNAL FIXATION (ORIF) DISTAL RADIUS FRACTURE
Anesthesia: General | Site: Arm Lower | Laterality: Left

## 2015-03-17 MED ORDER — LIDOCAINE HCL (PF) 1 % IJ SOLN
INTRAMUSCULAR | Status: DC | PRN
  Administered 2015-03-17: 15 mL

## 2015-03-17 MED ORDER — LIDOCAINE HCL (CARDIAC) 20 MG/ML IV SOLN
INTRAVENOUS | Status: DC | PRN
  Administered 2015-03-17: 70 mg via INTRAVENOUS

## 2015-03-17 MED ORDER — HYDROMORPHONE HCL 1 MG/ML IJ SOLN
0.2500 mg | INTRAMUSCULAR | Status: DC | PRN
  Administered 2015-03-17 (×4): 0.5 mg via INTRAVENOUS

## 2015-03-17 MED ORDER — HYDROMORPHONE HCL 1 MG/ML IJ SOLN
0.5000 mg | INTRAMUSCULAR | Status: AC | PRN
  Administered 2015-03-17 (×4): 0.5 mg via INTRAVENOUS

## 2015-03-17 MED ORDER — FENTANYL CITRATE (PF) 100 MCG/2ML IJ SOLN
INTRAMUSCULAR | Status: DC | PRN
  Administered 2015-03-17: 50 ug via INTRAVENOUS
  Administered 2015-03-17: 100 ug via INTRAVENOUS
  Administered 2015-03-17 (×2): 50 ug via INTRAVENOUS

## 2015-03-17 MED ORDER — MIDAZOLAM HCL 2 MG/2ML IJ SOLN
INTRAMUSCULAR | Status: AC
  Filled 2015-03-17: qty 4

## 2015-03-17 MED ORDER — MIDAZOLAM HCL 5 MG/5ML IJ SOLN
INTRAMUSCULAR | Status: DC | PRN
  Administered 2015-03-17: 2 mg via INTRAVENOUS

## 2015-03-17 MED ORDER — FENTANYL CITRATE (PF) 250 MCG/5ML IJ SOLN
INTRAMUSCULAR | Status: AC
  Filled 2015-03-17: qty 5

## 2015-03-17 MED ORDER — PROPOFOL 10 MG/ML IV BOLUS
INTRAVENOUS | Status: AC
  Filled 2015-03-17: qty 20

## 2015-03-17 MED ORDER — LACTATED RINGERS IV SOLN
Freq: Once | INTRAVENOUS | Status: AC
  Administered 2015-03-17: 16:00:00 via INTRAVENOUS

## 2015-03-17 MED ORDER — HYDROMORPHONE HCL 1 MG/ML IJ SOLN
INTRAMUSCULAR | Status: AC
  Filled 2015-03-17: qty 1

## 2015-03-17 MED ORDER — LIDOCAINE HCL (PF) 1 % IJ SOLN
INTRAMUSCULAR | Status: AC
  Filled 2015-03-17: qty 30

## 2015-03-17 MED ORDER — PROMETHAZINE HCL 25 MG/ML IJ SOLN
INTRAMUSCULAR | Status: DC
Start: 2015-03-17 — End: 2015-03-18
  Filled 2015-03-17: qty 1

## 2015-03-17 MED ORDER — HYDROMORPHONE HCL 1 MG/ML IJ SOLN
INTRAMUSCULAR | Status: DC
Start: 2015-03-17 — End: 2015-03-18
  Filled 2015-03-17: qty 2

## 2015-03-17 MED ORDER — OXYCODONE-ACETAMINOPHEN 5-325 MG PO TABS
1.0000 | ORAL_TABLET | Freq: Once | ORAL | Status: AC
  Administered 2015-03-17: 2 via ORAL

## 2015-03-17 MED ORDER — PROMETHAZINE HCL 25 MG/ML IJ SOLN: 6.2500 mg | INTRAMUSCULAR | Status: DC | PRN

## 2015-03-17 MED ORDER — PROPOFOL 10 MG/ML IV BOLUS
INTRAVENOUS | Status: DC | PRN
  Administered 2015-03-17: 100 mg via INTRAVENOUS
  Administered 2015-03-17: 200 mg via INTRAVENOUS
  Administered 2015-03-17: 50 mg via INTRAVENOUS

## 2015-03-17 MED ORDER — HYDROMORPHONE HCL 1 MG/ML IJ SOLN
INTRAMUSCULAR | Status: AC
  Filled 2015-03-17: qty 2

## 2015-03-17 MED ORDER — BUPIVACAINE HCL (PF) 0.25 % IJ SOLN
INTRAMUSCULAR | Status: AC
Start: 2015-03-17 — End: 2015-03-17
  Filled 2015-03-17: qty 30

## 2015-03-17 MED ORDER — 0.9 % SODIUM CHLORIDE (POUR BTL) OPTIME
TOPICAL | Status: DC | PRN
  Administered 2015-03-17: 1000 mL

## 2015-03-17 MED ORDER — CEFAZOLIN SODIUM-DEXTROSE 2-3 GM-% IV SOLR
INTRAVENOUS | Status: DC | PRN
  Administered 2015-03-17: 2 g via INTRAVENOUS

## 2015-03-17 MED ORDER — OXYCODONE-ACETAMINOPHEN 5-325 MG PO TABS: 1.0000 | ORAL_TABLET | Freq: Four times a day (QID) | ORAL | Status: DC | PRN

## 2015-03-17 MED ORDER — LACTATED RINGERS IV SOLN
INTRAVENOUS | Status: DC | PRN
  Administered 2015-03-17: 18:00:00 via INTRAVENOUS

## 2015-03-17 MED ORDER — BUPIVACAINE-EPINEPHRINE (PF) 0.5% -1:200000 IJ SOLN
INTRAMUSCULAR | Status: AC
  Filled 2015-03-17: qty 30

## 2015-03-17 MED ORDER — OXYCODONE-ACETAMINOPHEN 5-325 MG PO TABS
ORAL_TABLET | ORAL | Status: AC
  Filled 2015-03-17: qty 2

## 2015-03-17 SURGICAL SUPPLY — 53 items
BANDAGE COBAN STERILE 2 (GAUZE/BANDAGES/DRESSINGS) IMPLANT
BIT DRILL 2 FAST STEP (BIT) ×3 IMPLANT
BIT DRILL 2.5X4 QC (BIT) ×3 IMPLANT
BLADE SURG 15 STRL LF DISP TIS (BLADE) IMPLANT
BLADE SURG 15 STRL SS (BLADE)
BNDG COHESIVE 4X5 TAN STRL (GAUZE/BANDAGES/DRESSINGS) ×3 IMPLANT
BNDG ESMARK 4X9 LF (GAUZE/BANDAGES/DRESSINGS) ×3 IMPLANT
BNDG GAUZE ELAST 4 BULKY (GAUZE/BANDAGES/DRESSINGS) ×3 IMPLANT
BRUSH SCRUB EZ PLAIN DRY (MISCELLANEOUS) ×3 IMPLANT
CANISTER SUCTION 2500CC (MISCELLANEOUS) ×3 IMPLANT
CHLORAPREP W/TINT 26ML (MISCELLANEOUS) ×3 IMPLANT
CORDS BIPOLAR (ELECTRODE) ×3 IMPLANT
COVER SURGICAL LIGHT HANDLE (MISCELLANEOUS) ×3 IMPLANT
COVER TABLE BACK 60X90 (DRAPES) ×3 IMPLANT
CUFF TOURNIQUET SINGLE 18IN (TOURNIQUET CUFF) ×3 IMPLANT
CUFF TOURNIQUET SINGLE 24IN (TOURNIQUET CUFF) IMPLANT
DRAPE C-ARM 42X72 X-RAY (DRAPES) ×3 IMPLANT
DRAPE SURG 17X23 STRL (DRAPES) ×3 IMPLANT
DRSG ADAPTIC 3X8 NADH LF (GAUZE/BANDAGES/DRESSINGS) ×3 IMPLANT
DRSG EMULSION OIL 3X3 NADH (GAUZE/BANDAGES/DRESSINGS) IMPLANT
GAUZE SPONGE 4X4 12PLY STRL (GAUZE/BANDAGES/DRESSINGS) ×3 IMPLANT
GLOVE BIO SURGEON STRL SZ7.5 (GLOVE) ×3 IMPLANT
GLOVE BIOGEL PI IND STRL 8 (GLOVE) ×1 IMPLANT
GLOVE BIOGEL PI INDICATOR 8 (GLOVE) ×2
GOWN STRL REUS W/ TWL XL LVL3 (GOWN DISPOSABLE) ×3 IMPLANT
GOWN STRL REUS W/TWL XL LVL3 (GOWN DISPOSABLE) ×6
KIT BASIN OR (CUSTOM PROCEDURE TRAY) ×3 IMPLANT
NEEDLE HYPO 22GX1.5 SAFETY (NEEDLE) ×3 IMPLANT
NEEDLE HYPO 25X1 1.5 SAFETY (NEEDLE) IMPLANT
NS IRRIG 1000ML POUR BTL (IV SOLUTION) ×3 IMPLANT
PACK ORTHO EXTREMITY (CUSTOM PROCEDURE TRAY) ×3 IMPLANT
PAD CAST 4YDX4 CTTN HI CHSV (CAST SUPPLIES) ×1 IMPLANT
PADDING CAST ABS 4INX4YD NS (CAST SUPPLIES) ×2
PADDING CAST ABS COTTON 4X4 ST (CAST SUPPLIES) ×1 IMPLANT
PADDING CAST COTTON 4X4 STRL (CAST SUPPLIES) ×2
PEG SUBCHONDRAL SMOOTH 2.0X18 (Peg) ×3 IMPLANT
PEG SUBCHONDRAL SMOOTH 2.0X20 (Peg) ×9 IMPLANT
PEG SUBCHONDRAL SMOOTH 2.0X22 (Peg) ×6 IMPLANT
PENCIL BUTTON HOLSTER BLD 10FT (ELECTRODE) IMPLANT
PLATE SHORT 21.6X48.9 NRRW LT (Plate) ×3 IMPLANT
RUBBERBAND STERILE (MISCELLANEOUS) IMPLANT
SCREW BN 12X3.5XNS CORT TI (Screw) ×1 IMPLANT
SCREW CORT 3.5X12 (Screw) ×2 IMPLANT
SCREW CORT 3.5X14 LNG (Screw) ×6 IMPLANT
SLING ARM IMMOBILIZER MED (SOFTGOODS) ×3 IMPLANT
SUT VIC AB 2-0 CT3 27 (SUTURE) ×3 IMPLANT
SUT VICRYL 4-0 PS2 18IN ABS (SUTURE) IMPLANT
SUT VICRYL RAPIDE 4/0 PS 2 (SUTURE) ×3 IMPLANT
SYRINGE 10CC LL (SYRINGE) ×3 IMPLANT
TOWEL OR 17X24 6PK STRL BLUE (TOWEL DISPOSABLE) ×3 IMPLANT
TUBE CONNECTING 12'X1/4 (SUCTIONS) ×1
TUBE CONNECTING 12X1/4 (SUCTIONS) ×2 IMPLANT
UNDERPAD 30X30 INCONTINENT (UNDERPADS AND DIAPERS) ×3 IMPLANT

## 2015-03-17 NOTE — Op Note (Signed)
Buyer, [id number] Freddrick [id number] Reeves Fransis 6 Wri astleMarylandNorthern California Advanced Surgery Center LP ommi Rumps 67mFreddrick March10272Gwyneth [id number] [place] Reeves Fransisca Connors  38mFreddrick March10272Buyer, retailGwyneth [id number] Reeves  Fransisca ConnorsStocktonMarylandSsm St. [facility] Tommi Rumps 67mFreddrick March10272Buyer, retailGwyneth [id number] Reeves  Fransisca ConnorsHawaiian GardensMarylandCollege Park Surgery Center LLC Tommi Rumps 28mFreddrick March10272Buyer, retailGwyneth [id number] Reeves  Fransisca ConnorsStrawnMaryland

## 2015-03-17 NOTE — Transfer of Care (Signed)
Immediate Anesthesia Transfer of Care Note  Patient: Karen Cline  Procedure(s) Performed: Procedure(s): OPEN REDUCTION INTERNAL FIXATION (ORIF) DISTAL RADIAL FRACTURE (Left)  Patient Location: PACU  Anesthesia Type:General  Level of Consciousness: awake, alert  and oriented  Airway & Oxygen Therapy: Patient Spontanous Breathing and Patient connected to nasal cannula oxygen  Post-op Assessment: Report given to RN and Post -op Vital signs reviewed and stable  Post vital signs: Reviewed and stable  Last Vitals:  Filed Vitals:   03/17/15 1604  BP: 139/86  Pulse: 95  Temp: 36.9 C  Resp: 18    Complications: No apparent anesthesia complications

## 2015-03-17 NOTE — Anesthesia Postprocedure Evaluation (Signed)
  Anesthesia Post-op Note  Patient: Karen Cline  Procedure(s) Performed: Procedure(s): OPEN REDUCTION INTERNAL FIXATION (ORIF) DISTAL RADIAL FRACTURE (Left)  Patient Location: PACU  Anesthesia Type:General  Level of Consciousness: awake, alert , oriented and patient cooperative  Airway and Oxygen Therapy: Patient Spontanous Breathing  Post-op Pain: mild  Post-op Assessment: Post-op Vital signs reviewed, Patient's Cardiovascular Status Stable, Respiratory Function Stable, Patent Airway, No signs of Nausea or vomiting and Pain level controlled              Post-op Vital Signs: Reviewed and stable  Last Vitals:  Filed Vitals:   03/17/15 2015  BP: 144/80  Pulse: 76  Temp:   Resp:     Complications: No apparent anesthesia complications

## 2015-03-17 NOTE — Anesthesia Preprocedure Evaluation (Signed)
Anesthesia Evaluation  Patient identified by MRN, date of birth, ID band Patient awake    Reviewed: Allergy & Precautions, NPO status , Patient's Chart, lab work & pertinent test results  Airway Mallampati: II  TM Distance: >3 FB Neck ROM: Full    Dental no notable dental hx.    Pulmonary Current Smoker,  breath sounds clear to auscultation  Pulmonary exam normal       Cardiovascular negative cardio ROS Normal cardiovascular examRhythm:Regular Rate:Normal     Neuro/Psych negative neurological ROS  negative psych ROS   GI/Hepatic negative GI ROS, (+)     substance abuse  alcohol use and IV drug use,   Endo/Other  negative endocrine ROS  Renal/GU negative Renal ROS  negative genitourinary   Musculoskeletal negative musculoskeletal ROS (+)   Abdominal   Peds negative pediatric ROS (+)  Hematology negative hematology ROS (+)   Anesthesia Other Findings   Reproductive/Obstetrics negative OB ROS                             Anesthesia Physical Anesthesia Plan  ASA: III  Anesthesia Plan: General   Post-op Pain Management:    Induction: Intravenous  Airway Management Planned: LMA  Additional Equipment:   Intra-op Plan:   Post-operative Plan: Extubation in OR  Informed Consent: I have reviewed the patients History and Physical, chart, labs and discussed the procedure including the risks, benefits and alternatives for the proposed anesthesia with the patient or authorized representative who has indicated his/her understanding and acceptance.   Dental advisory given  Plan Discussed with: CRNA and Surgeon  Anesthesia Plan Comments:         Anesthesia Quick Evaluation

## 2015-03-17 NOTE — H&P (Signed)
ORTHOPAEDIC CONSULTATION HISTORY & PHYSICAL REQUESTING PHYSICIAN: Mack Hook, MD  Chief Complaint: left wrist injury  HPI: Karen Cline is a 29 y.o. female who injured her left wrist horse playing with another. She presented to the emergency department, was evaluated, x-rays obtained, and determined to have a comminuted displaced intra-articular distal radius fracture. She was placed into a splint. She has had some continued pain and swelling and presented to the emergency department this afternoon.  Past Medical History  Diagnosis Date  . Alcohol abuse   . Polysubstance abuse     Xanax, Heroin  . GERD (gastroesophageal reflux disease)   . Seizures   . Neuromuscular disorder     Nerve pain Left ankle   Past Surgical History  Procedure Laterality Date  . Ankle fracture surgery      history restored after error with record merge      . Wrist surgery Left 2012    tendon surgery  . Cesarean section  2009, 2012   Social History   Social History  . Marital Status: Single    Spouse Name: N/A  . Number of Children: N/A  . Years of Education: N/A   Social History Main Topics  . Smoking status: Current Every Day Smoker -- 0.50 packs/day for 7 years    Types: Cigarettes  . Smokeless tobacco: Never Used  . Alcohol Use: Yes     Comment: ETOH abuse  . Drug Use: Yes     Comment: xanax, morphine, heroine, dilaudid  . Sexual Activity: Yes   Other Topics Concern  . None   Social History Narrative   ** Merged History Encounter **       History reviewed. No pertinent family history. Allergies  Allergen Reactions  . Tramadol     seizures  . Ultram [Tramadol]     seizures   Prior to Admission medications   Medication Sig Start Date End Date Taking? Authorizing Provider  cyclobenzaprine (FLEXERIL) 10 MG tablet Take 1 tablet (10 mg total) by mouth 2 (two) times daily as needed for muscle spasms. 03/11/15  Yes Danelle Berry, PA-C  HYDROcodone-acetaminophen (NORCO) 5-325  MG per tablet Take 1 tablet by mouth every 4 (four) hours as needed for severe pain. 03/15/15  Yes Loren Racer, MD  ibuprofen (ADVIL,MOTRIN) 800 MG tablet Take 1 tablet (800 mg total) by mouth 3 (three) times daily. 03/11/15  Yes Danelle Berry, PA-C  pantoprazole (PROTONIX) 40 MG tablet Take 40 mg by mouth daily.   Yes Historical Provider, MD  gabapentin (NEURONTIN) 400 MG capsule Take 2 capsules (800 mg total) by mouth 3 (three) times daily. Patient not taking: Reported on 03/17/2015 07/06/14   Rachael Fee, MD  oxyCODONE-acetaminophen (PERCOCET) 5-325 MG per tablet Take 1-2 tablets by mouth every 6 (six) hours as needed for severe pain. 03/17/15   Mack Hook, MD   No results found.  Positive ROS: All other systems have been reviewed and were otherwise negative with the exception of those mentioned in the HPI and as above.  Physical Exam: Vitals: Refer to EMR. Constitutional:  WD, WN, NAD HEENT:  NCAT, EOMI Neuro/Psych:  Alert & oriented to person, place, and time; appropriate mood & affect Lymphatic: No generalized extremity edema or lymphadenopathy Extremities / MSK:  The extremities are normal with respect to appearance, ranges of motion, joint stability, muscle strength/tone, sensation, & perfusion except as otherwise noted:  Left upper extremity is in a sugar tong splint. Fingers are little puffy and swollen.  Distally, she is neurovascularly intact. No pain with palpation about the exposed portions of the elbow  Assessment: Comminuted displaced intra-articular distal radius fracture on the left  Plan: I discussed these findings with her and options for proceeding. After some discussion we decided to proceed with operative treatment. I discussed the details of open treatment with volar plate fixation, to include intraoperative assessment of the distal radial ulnar joint. Questions were invited and answered and consent obtained. This will be performed today in the main hospital, with  discharge following.  Cliffton Asters Janee Morn, MD      Orthopaedic & Hand Surgery [facility] Orthopaedic & Sports Medicine [facility] [address] [place], [place]  [address] Office: [phone] Mobile: [phone]

## 2015-03-17 NOTE — Discharge Instructions (Signed)
Discharge Instructions ° ° °You have a dressing with a plaster splint incorporated in it. °Move your fingers as much as possible, making a full fist and fully opening the fist. °Elevate your hand to reduce pain & swelling of the digits.  Ice over the operative site may be helpful to reduce pain & swelling.  DO NOT USE HEAT. °Pain medicine has been prescribed for you.  °Use your medicine as needed over the first 48 hours, and then you can begin to taper your use.  You may use Tylenol in place of your prescribed pain medication, but not IN ADDITION to it. °Leave the dressing in place until you return to our office.  °You may shower, but keep the bandage clean & dry.  °You may drive a car when you are off of prescription pain medications and can safely control your vehicle with both hands. °Our office will call you to arrange follow-up ° ° °Please call [phone] during normal business hours or [phone] after hours for any problems. Including the following: ° °- excessive redness of the incisions °- drainage for more than 4 days °- fever of more than 101.5 F ° °*Please note that pain medications will not be refilled after hours or on weekends. ° °

## 2015-03-17 NOTE — Anesthesia Procedure Notes (Signed)
Procedure Name: LMA Insertion Date/Time: 03/17/2015 5:58 PM Performed by: Charm Barges, Markeya Mincy R Pre-anesthesia Checklist: Patient identified, Emergency Drugs available, Suction available, Patient being monitored and Timeout performed Patient Re-evaluated:Patient Re-evaluated prior to inductionOxygen Delivery Method: Circle system utilized Preoxygenation: Pre-oxygenation with 100% oxygen Intubation Type: IV induction Ventilation: Mask ventilation without difficulty LMA: LMA inserted LMA Size: 4.0 Number of attempts: 1 Placement Confirmation: positive ETCO2 Tube secured with: Tape Dental Injury: Teeth and Oropharynx as per pre-operative assessment

## 2015-03-18 ENCOUNTER — Encounter (HOSPITAL_COMMUNITY): Payer: Self-pay | Admitting: Orthopedic Surgery

## 2015-03-29 ENCOUNTER — Ambulatory Visit: Payer: Medicaid Other | Attending: Orthopedic Surgery | Admitting: Occupational Therapy

## 2015-04-01 ENCOUNTER — Emergency Department (HOSPITAL_COMMUNITY): Payer: Self-pay

## 2015-04-01 ENCOUNTER — Emergency Department (HOSPITAL_COMMUNITY): Payer: Medicaid Other

## 2015-04-01 ENCOUNTER — Emergency Department (HOSPITAL_COMMUNITY)
Admission: EM | Admit: 2015-04-01 | Discharge: 2015-04-01 | Disposition: A | Discharge status: Home / Self Care | Payer: Self-pay | Attending: Emergency Medicine | Admitting: Emergency Medicine

## 2015-04-01 ENCOUNTER — Encounter (HOSPITAL_COMMUNITY): Payer: Self-pay | Admitting: Emergency Medicine

## 2015-04-01 DIAGNOSIS — Y9289 Other specified places as the place of occurrence of the external cause: Secondary | ICD-10-CM | POA: Insufficient documentation

## 2015-04-01 DIAGNOSIS — Y9389 Activity, other specified: Secondary | ICD-10-CM | POA: Insufficient documentation

## 2015-04-01 DIAGNOSIS — W108XXA Fall (on) (from) other stairs and steps, initial encounter: Secondary | ICD-10-CM | POA: Insufficient documentation

## 2015-04-01 DIAGNOSIS — S52612A Displaced fracture of left ulna styloid process, initial encounter for closed fracture: Secondary | ICD-10-CM | POA: Insufficient documentation

## 2015-04-01 DIAGNOSIS — Z720 Tobacco use: Secondary | ICD-10-CM | POA: Insufficient documentation

## 2015-04-01 DIAGNOSIS — S52502D Unspecified fracture of the lower end of left radius, subsequent encounter for closed fracture with routine healing: Secondary | ICD-10-CM

## 2015-04-01 DIAGNOSIS — S52591A Other fractures of lower end of right radius, initial encounter for closed fracture: Secondary | ICD-10-CM | POA: Insufficient documentation

## 2015-04-01 DIAGNOSIS — S4991XA Unspecified injury of right shoulder and upper arm, initial encounter: Secondary | ICD-10-CM | POA: Insufficient documentation

## 2015-04-01 DIAGNOSIS — Y998 Other external cause status: Secondary | ICD-10-CM | POA: Insufficient documentation

## 2015-04-01 DIAGNOSIS — Z79899 Other long term (current) drug therapy: Secondary | ICD-10-CM | POA: Insufficient documentation

## 2015-04-01 DIAGNOSIS — K219 Gastro-esophageal reflux disease without esophagitis: Secondary | ICD-10-CM | POA: Insufficient documentation

## 2015-04-01 DIAGNOSIS — S52501A Unspecified fracture of the lower end of right radius, initial encounter for closed fracture: Secondary | ICD-10-CM

## 2015-04-01 MED ORDER — HYDROCODONE-ACETAMINOPHEN 5-325 MG PO TABS
1.0000 | ORAL_TABLET | Freq: Once | ORAL | Status: AC
  Administered 2015-04-01: 1 via ORAL
  Filled 2015-04-01: qty 1

## 2015-04-01 NOTE — Consult Note (Signed)
ORTHOPAEDIC CONSULTATION HISTORY & PHYSICAL REQUESTING PHYSICIAN: Elwin Mocha, MD  Chief Complaint: right wrist pain  HPI: Karen Cline is a 29 y.o. female who fell onto her right arm on the stairs on Tuesday, and presents for ongoing pain and swelling.  She is just over 2 weeks postop from ORIF L DRFx.  I provided a Rx for Percocet this past Friday, and she failed to keep her appt on Monday with [facility] for splint construction or with me for her initial postop appt.  Past Medical History  Diagnosis Date  . Alcohol abuse   . Polysubstance abuse     Xanax, Heroin  . GERD (gastroesophageal reflux disease)   . Seizures   . Neuromuscular disorder     Nerve pain Left ankle   Past Surgical History  Procedure Laterality Date  . Ankle fracture surgery      history restored after error with record merge      . Wrist surgery Left 2012    tendon surgery  . Cesarean section  2009, 2012  . Open reduction internal fixation (orif) distal radial fracture Left 03/17/2015    Procedure: OPEN REDUCTION INTERNAL FIXATION (ORIF) DISTAL RADIAL FRACTURE;  Surgeon: Mack Hook, MD;  Location: [facility] OR;  Service: Orthopedics;  Laterality: Left;   Social History   Social History  . Marital Status: Single    Spouse Name: N/A  . Number of Children: N/A  . Years of Education: N/A   Social History Main Topics  . Smoking status: Current Every Day Smoker -- 0.50 packs/day for 7 years    Types: Cigarettes  . Smokeless tobacco: Never Used  . Alcohol Use: Yes     Comment: ETOH abuse  . Drug Use: Yes     Comment: xanax, morphine, heroine, dilaudid  . Sexual Activity: Yes   Other Topics Concern  . None   Social History Narrative   ** Merged History Encounter **       History reviewed. No pertinent family history. Allergies  Allergen Reactions  . Tramadol     seizures  . Ultram [Tramadol]     seizures   Prior to Admission medications   Medication Sig Start Date End Date Taking?  Authorizing Provider  cyclobenzaprine (FLEXERIL) 10 MG tablet Take 1 tablet (10 mg total) by mouth 2 (two) times daily as needed for muscle spasms. 03/11/15   Danelle Berry, PA-C  HYDROcodone-acetaminophen (NORCO) 5-325 MG per tablet Take 1 tablet by mouth every 4 (four) hours as needed for severe pain. 03/15/15   Loren Racer, MD  ibuprofen (ADVIL,MOTRIN) 800 MG tablet Take 1 tablet (800 mg total) by mouth 3 (three) times daily. 03/11/15   Danelle Berry, PA-C  oxyCODONE-acetaminophen (PERCOCET) 5-325 MG per tablet Take 1-2 tablets by mouth every 6 (six) hours as needed for severe pain. 03/17/15   Mack Hook, MD  pantoprazole (PROTONIX) 40 MG tablet Take 40 mg by mouth daily.    Historical Provider, MD   Dg Forearm Left  04/01/2015   CLINICAL DATA:  Evaluate alignment after left arm surgery for ORIF 03/17/2015  EXAM: LEFT FOREARM - 2 VIEW  COMPARISON:  03/17/2015, 03/15/2015  FINDINGS: Displaced ulnar styloid process fracture stable. Comminuted fracture distal radial metaphysis with compression plate. Near anatomic position and alignment of fracture fragments with minimal residual displacement.  IMPRESSION: Near anatomic positioning and alignment.   Electronically Signed   By: Esperanza Heir M.D.   On: 04/01/2015 15:58   Dg Forearm Right  04/01/2015   CLINICAL DATA:  fall, right arm pain Pt states that she fell and landed onto her right arm last week, pt complaining of mostly right wrist pains with swelling and obvious bruising  EXAM: RIGHT FOREARM - 2 VIEW  COMPARISON:  None.  FINDINGS: Minimally displaced oblique fracture across the distal radial metaphysis. 1 cm sclerotic oval density over the metaphysis may represent a bone island or displaced fracture fragment.  IMPRESSION: Minimally displaced fracture distal radial metaphysis.   Electronically Signed   By: Esperanza Heir M.D.   On: 04/01/2015 14:47    Positive ROS: All other systems have been reviewed and were otherwise negative with the  exception of those mentioned in the HPI and as above.  Physical Exam: Vitals: Refer to EMR. Constitutional:  WD, WN, NAD HEENT:  NCAT, EOMI Neuro/Psych:  Alert & oriented to person, place, and time; appropriate mood & affect Lymphatic: No generalized extremity edema or lymphadenopathy Extremities / MSK:  The extremities are normal with respect to appearance, ranges of motion, joint stability, muscle strength/tone, sensation, & perfusion except as otherwise noted:  LUE:  Incision healing well, without evidence for infection.  Some circular redness where deep Vicryl rests.  Fairly good WE/WF/ PRO/SUP.  Full digital ROM.  NVI  RUE:  TTP at distal radius, DRUJ stable, fairly good digital ROM, slightly diminished wrist ROM  NVI  Assessment: 1. Acute minimally displaced R DRFx of metaphysis 2. 2-3 weeks s/p ORIF L DRFx, doing well  Plan: B removeable wrist splints, ROM exercises reviewed, activity restrictions reviewed.  RTC 1 month with new xrays including IL of both wrists. WORK STATUS:  No work with either hand for 6 weeks.  Cliffton Asters Janee Morn, MD      Orthopaedic & Hand Surgery [facility] Orthopaedic & Sports Medicine [facility] [address] [place], [place]  [address] Office: [phone] Mobile: [phone]

## 2015-04-01 NOTE — ED Notes (Signed)
Pt stable, ambulatory, states understanding of discharge instructions 

## 2015-04-01 NOTE — ED Provider Notes (Signed)
CSN: [id number]     Arrival date & time 04/01/15  1326 History  This chart was scribed for Karen Pel, PA-C, working with Karen Mocha, MD by Chestine Spore, ED Scribe. The patient was seen in room TR08C/TR08C at 2:55 PM.    Chief Complaint  Patient presents with  . Arm Pain     The history is provided by the patient. No language interpreter was used.    Catheryn Cline 29 y.o.female  PCP: No PCP Per Patient  Blood pressure 130/80, pulse 55, temperature 98.3 F (36.8 C), temperature source Oral, resp. rate 18, last menstrual period 02/25/2015, SpO2 98 %.  SIGNIFICANT PMH: alcohol abuse and polysubstance abuse, heroin user CHIEF COMPLAINT: right arm pain  When: yesterday Mechanism: tripped going up stairs carrying groceries while trying to avoid hitting her left arm that she had surgery on recently by Dr. Janee Cline 03/17/15. Pt notes that she missed her appointment with Dr. Janee Cline with Monday to get a cast. She currently has a dirty splint on. Chronicity: no Location: right forearm Radiation: none Quality and severity: painful and moderate Treatments tried: none Alleviating factors: none Worsening factors: movement  Associated Symptoms: bruising to right forearm. Negative ROS: head injury, LOC, gross deformity, laceration, numbness, and weakness, denies left wrist pain   Past Medical History  Diagnosis Date  . Alcohol abuse   . Polysubstance abuse     Xanax, Heroin  . GERD (gastroesophageal reflux disease)   . Seizures   . Neuromuscular disorder     Nerve pain Left ankle   Past Surgical History  Procedure Laterality Date  . Ankle fracture surgery      history restored after error with record merge      . Wrist surgery Left 2012    tendon surgery  . Cesarean section  2009, 2012  . Open reduction internal fixation (orif) distal radial fracture Left 03/17/2015    Procedure: OPEN REDUCTION INTERNAL FIXATION (ORIF) DISTAL RADIAL FRACTURE;  Surgeon: Mack Hook, MD;   Location: [facility] OR;  Service: Orthopedics;  Laterality: Left;   History reviewed. No pertinent family history. Social History  Substance Use Topics  . Smoking status: Current Every Day Smoker -- 0.50 packs/day for 7 years    Types: Cigarettes  . Smokeless tobacco: Never Used  . Alcohol Use: Yes     Comment: ETOH abuse   OB History    No data available     Review of Systems  A complete 10 system review of systems was obtained and all systems are negative except as noted in the HPI and PMH.    Allergies  Tramadol and Ultram  Home Medications   Prior to Admission medications   Medication Sig Start Date End Date Taking? Authorizing Provider  cyclobenzaprine (FLEXERIL) 10 MG tablet Take 1 tablet (10 mg total) by mouth 2 (two) times daily as needed for muscle spasms. 03/11/15   Danelle Berry, PA-C  HYDROcodone-acetaminophen (NORCO) 5-325 MG per tablet Take 1 tablet by mouth every 4 (four) hours as needed for severe pain. 03/15/15   Loren Racer, MD  ibuprofen (ADVIL,MOTRIN) 800 MG tablet Take 1 tablet (800 mg total) by mouth 3 (three) times daily. 03/11/15   Danelle Berry, PA-C  oxyCODONE-acetaminophen (PERCOCET) 5-325 MG per tablet Take 1-2 tablets by mouth every 6 (six) hours as needed for severe pain. 03/17/15   Mack Hook, MD  pantoprazole (PROTONIX) 40 MG tablet Take 40 mg by mouth daily.    Historical Provider, MD  BP 130/80 mmHg  Pulse 55  Temp(Src) 98.3 F (36.8 C) (Oral)  Resp 18  SpO2 98%  LMP 02/25/2015 Physical Exam  Constitutional: She is oriented to person, place, and time. She appears well-developed and well-nourished. No distress.  HENT:  Head: Normocephalic and atraumatic.  Eyes: EOM are normal.  Neck: Neck supple.  Cardiovascular: Normal rate.   Pulmonary/Chest: Effort normal. No respiratory distress.  Abdominal: Soft. There is no tenderness.  Musculoskeletal:       Right wrist: She exhibits decreased range of motion. She exhibits no deformity.  Decreased  ROM to right wrist. Significant ecchymosis to ventral and volar forearm. No obvious deformities. NVI.  Neurological: She is alert and oriented to person, place, and time.  Skin: Skin is warm and dry.  Psychiatric: She has a normal mood and affect. Her behavior is normal.  Nursing note and vitals reviewed.  ED Course  Procedures (including critical care time) DIAGNOSTIC STUDIES: Oxygen Saturation is 98%, RA, nl by my interpretation.    COORDINATION OF CARE: 3:00 PM Discussed treatment plan with pt at bedside which includes right arm xray and pt agreed to plan.    Labs Review Labs Reviewed - No data to display  Imaging Review Dg Forearm Right  04/01/2015   CLINICAL DATA:  fall, right arm pain Pt states that she fell and landed onto her right arm last week, pt complaining of mostly right wrist pains with swelling and obvious bruising  EXAM: RIGHT FOREARM - 2 VIEW  COMPARISON:  None.  FINDINGS: Minimally displaced oblique fracture across the distal radial metaphysis. 1 cm sclerotic oval density over the metaphysis may represent a bone island or displaced fracture fragment.  IMPRESSION: Minimally displaced fracture distal radial metaphysis.   Electronically Signed   By: Esperanza Heir M.D.   On: 04/01/2015 14:47   I have personally reviewed and evaluated these images as part of my medical decision-making.   EKG Interpretation None     MDM   Final diagnoses:  Distal radial fracture, right, closed, initial encounter  Distal radial fracture, left, closed, with routine healing, subsequent encounter   Wrist xray of left hand shows:  IMPRESSION: Minimally displaced fracture distal radial metaphysis. I discussed case with Dr. Janee Cline, pt is noncompliant with recent left arm and now she has fracture to the right arm.  Concern for trying to arrange follow-up with two different orthopedic doctors and trying to have her arrange two different PT sessions, ect.   [facility] paged to place  bilateral short arm splints.  @ 3: 35 pm Dr. Janee Cline called back, he requests that I get xrays for her left post-op forearm for re-evaluation. He will come to the ED to see her for re-evaluation. I discussed this patient and she reports being willing to wait for Dr. Janee Cline to come see her.  At end of shift patient hand-off to Ebbie Ridge, PA-C   I personally performed the services described in this documentation, which was scribed in my presence. The recorded information has been reviewed and is accurate.    Karen Pel, PA-C 04/01/15 1543  Karen Mocha, MD 04/02/15 [phone]

## 2015-04-01 NOTE — ED Notes (Signed)
Pt no answer x2 for triage

## 2015-04-01 NOTE — Progress Notes (Signed)
Orthopedic Tech Progress Note Patient Details:  Karen Cline 1985/09/17 [id number] Applied fiberglass sugar tong splint to RUE.  Pulses, sensation, motion intact before and after splinting.  Capillary refill less than 2 seconds before and after splinting.  Placed splinted RUE in arm sling.  Applied fiberglass volar splint to LUE. Pulses, sensation, motion intact before and after splinting.  Capillary refill less than 2 seconds before and after splinting. Ortho Devices Type of Ortho Device: Arm sling, Sugartong splint, Volar splint Ortho Device/Splint Location: RUE, LUE Ortho Device/Splint Interventions: Application   Lesle Chris 04/01/2015, 3:59 PM

## 2015-04-01 NOTE — ED Notes (Signed)
Pt sts right arm injury from fall; pt has splint to left hand and sts had sx recently; large bruise noted

## 2015-04-01 NOTE — ED Notes (Signed)
Ortho paged. 

## 2015-04-01 NOTE — Discharge Instructions (Signed)
Work Status:  No work with either hand for 6 weeks.  Will re-assess at that time.  Patient has broken wrists on both sides.  Cast or Splint Care Casts and splints support injured limbs and keep bones from moving while they heal. It is important to care for your cast or splint at home.  HOME CARE INSTRUCTIONS  Keep the cast or splint uncovered during the drying period. It can take 24 to 48 hours to dry if it is made of plaster. A fiberglass cast will dry in less than 1 hour.  Do not rest the cast on anything harder than a pillow for the first 24 hours.  Do not put weight on your injured limb or apply pressure to the cast until your health care provider gives you permission.  Keep the cast or splint dry. Wet casts or splints can lose their shape and may not support the limb as well. A wet cast that has lost its shape can also create harmful pressure on your skin when it dries. Also, wet skin can become infected.  Cover the cast or splint with a plastic bag when bathing or when out in the rain or snow. If the cast is on the trunk of the body, take sponge baths until the cast is removed.  If your cast does become wet, dry it with a towel or a blow dryer on the cool setting only.  Keep your cast or splint clean. Soiled casts may be wiped with a moistened cloth.  Do not place any hard or soft foreign objects under your cast or splint, such as cotton, toilet paper, lotion, or powder.  Do not try to scratch the skin under the cast with any object. The object could get stuck inside the cast. Also, scratching could lead to an infection. If itching is a problem, use a blow dryer on a cool setting to relieve discomfort.  Do not trim or cut your cast or remove padding from inside of it.  Exercise all joints next to the injury that are not immobilized by the cast or splint. For example, if you have a long leg cast, exercise the hip joint and toes. If you have an arm cast or splint, exercise the  shoulder, elbow, thumb, and fingers.  Elevate your injured arm or leg on 1 or 2 pillows for the first 1 to 3 days to decrease swelling and pain.It is best if you can comfortably elevate your cast so it is higher than your heart. SEEK MEDICAL CARE IF:   Your cast or splint cracks.  Your cast or splint is too tight or too loose.  You have unbearable itching inside the cast.  Your cast becomes wet or develops a soft spot or area.  You have a bad smell coming from inside your cast.  You get an object stuck under your cast.  Your skin around the cast becomes red or raw.  You have new pain or worsening pain after the cast has been applied. SEEK IMMEDIATE MEDICAL CARE IF:   You have fluid leaking through the cast.  You are unable to move your fingers or toes.  You have discolored (blue or white), cool, painful, or very swollen fingers or toes beyond the cast.  You have tingling or numbness around the injured area.  You have severe pain or pressure under the cast.  You have any difficulty with your breathing or have shortness of breath.  You have chest pain. Document Released: 06/30/2000 Document  Revised: 04/23/2013 Document Reviewed: 01/09/2013 [facility] Patient Information 2015 [place], [place]. This information is not intended to replace advice given to you by your health care provider. Make sure you discuss any questions you have with your health care provider.  Wrist Fracture A wrist fracture is a break or crack in one of the bones of your wrist. Your wrist is made up of eight small bones at the palm of your hand (carpal bones) and two long bones that make up your forearm (radius and ulna).  CAUSES   A direct blow to the wrist.  Falling on an outstretched hand.  Trauma, such as a car accident or a fall. RISK FACTORS Risk factors for wrist fracture include:   Participating in contact and high-risk sports, such as skiing, biking, and ice skating.  Taking steroid  medicines.  Smoking.  Being female.  Being Caucasian.  Drinking more than three alcoholic beverages per day.  Having low or lowered bone density (osteoporosis or osteopenia).  Age. Older adults have decreased bone density.  Women who have had menopause.  History of previous fractures. SIGNS AND SYMPTOMS Symptoms of wrist fractures include tenderness, bruising, and inflammation. Additionally, the wrist may hang in an odd position or appear deformed.  DIAGNOSIS Diagnosis may include:  Physical exam.  X-ray. TREATMENT Treatment depends on many factors, including the nature and location of the fracture, your age, and your activity level. Treatment for wrist fracture can be nonsurgical or surgical.  Nonsurgical Treatment A plaster cast or splint may be applied to your wrist if the bone is in a good position. If the fracture is not in good position, it may be necessary for your health care provider to realign it before applying a splint or cast. Usually, a cast or splint will be worn for several weeks.  Surgical Treatment Sometimes the position of the bone is so far out of place that surgery is required to apply a device to hold it together as it heals. Depending on the fracture, there are a number of options for holding the bone in place while it heals, such as a cast and metal pins.  HOME CARE INSTRUCTIONS  Keep your injured wrist elevated and move your fingers as much as possible.  Do not put pressure on any part of your cast or splint. It may break.   Use a plastic bag to protect your cast or splint from water while bathing or showering. Do not lower your cast or splint into water.  Take medicines only as directed by your health care provider.  Keep your cast or splint clean and dry. If it becomes wet, damaged, or suddenly feels too tight, contact your health care provider right away.  Do not use any tobacco products including cigarettes, chewing tobacco, or electronic  cigarettes. Tobacco can delay bone healing. If you need help quitting, ask your health care provider.  Keep all follow-up visits as directed by your health care provider. This is important.  Ask your health care provider if you should take supplements of calcium and vitamins C and D to promote bone healing. SEEK MEDICAL CARE IF:   Your cast or splint is damaged, breaks, or gets wet.  You have a fever.  You have chills.  You have continued severe pain or more swelling than you did before the cast was put on. SEEK IMMEDIATE MEDICAL CARE IF:   Your hand or fingernails on the injured arm turn blue or gray, or feel cold or numb.  You  have decreased feeling in the fingers of your injured arm. MAKE SURE YOU:  Understand these instructions.  Will watch your condition.  Will get help right away if you are not doing well or get worse. Document Released: 04/12/2005 Document Revised: 11/17/2013 Document Reviewed: 07/21/2011 [facility] Patient Information 2015 [place], [place]. This information is not intended to replace advice given to you by your health care provider. Make sure you discuss any questions you have with your health care provider.

## 2015-07-18 ENCOUNTER — Encounter (HOSPITAL_COMMUNITY): Payer: Self-pay | Admitting: Nurse Practitioner

## 2015-07-18 ENCOUNTER — Emergency Department (HOSPITAL_COMMUNITY)
Admission: EM | Admit: 2015-07-18 | Discharge: 2015-07-18 | Disposition: A | Discharge status: Home / Self Care | Payer: Medicaid Other | Attending: Emergency Medicine | Admitting: Emergency Medicine

## 2015-07-18 DIAGNOSIS — L0291 Cutaneous abscess, unspecified: Secondary | ICD-10-CM

## 2015-07-18 DIAGNOSIS — L0211 Cutaneous abscess of neck: Secondary | ICD-10-CM | POA: Insufficient documentation

## 2015-07-18 DIAGNOSIS — Z791 Long term (current) use of non-steroidal anti-inflammatories (NSAID): Secondary | ICD-10-CM | POA: Insufficient documentation

## 2015-07-18 DIAGNOSIS — Z79899 Other long term (current) drug therapy: Secondary | ICD-10-CM | POA: Insufficient documentation

## 2015-07-18 DIAGNOSIS — Z8669 Personal history of other diseases of the nervous system and sense organs: Secondary | ICD-10-CM | POA: Insufficient documentation

## 2015-07-18 DIAGNOSIS — F17210 Nicotine dependence, cigarettes, uncomplicated: Secondary | ICD-10-CM | POA: Insufficient documentation

## 2015-07-18 DIAGNOSIS — K219 Gastro-esophageal reflux disease without esophagitis: Secondary | ICD-10-CM | POA: Insufficient documentation

## 2015-07-18 MED ORDER — SULFAMETHOXAZOLE-TRIMETHOPRIM 800-160 MG PO TABS
1.0000 | ORAL_TABLET | Freq: Once | ORAL | Status: AC
  Administered 2015-07-18: 1 via ORAL
  Filled 2015-07-18: qty 1

## 2015-07-18 MED ORDER — LIDOCAINE HCL (PF) 1 % IJ SOLN
5.0000 mL | Freq: Once | INTRAMUSCULAR | Status: AC
  Administered 2015-07-18: 5 mL via INTRADERMAL
  Filled 2015-07-18: qty 5

## 2015-07-18 MED ORDER — LIDOCAINE-EPINEPHRINE-TETRACAINE (LET) SOLUTION
3.0000 mL | Freq: Once | NASAL | Status: AC
Start: 2015-07-18 — End: 2015-07-18
  Administered 2015-07-18: 3 mL via TOPICAL
  Filled 2015-07-18: qty 3

## 2015-07-18 MED ORDER — IBUPROFEN 400 MG PO TABS
600.0000 mg | ORAL_TABLET | Freq: Once | ORAL | Status: AC
  Administered 2015-07-18: 600 mg via ORAL
  Filled 2015-07-18: qty 1

## 2015-07-18 MED ORDER — SULFAMETHOXAZOLE-TRIMETHOPRIM 800-160 MG PO TABS: 1.0000 | ORAL_TABLET | Freq: Two times a day (BID) | ORAL | Status: DC

## 2015-07-18 NOTE — Discharge Instructions (Signed)
Return to the emergency room for wound check and packing removal in 48 hours.        If you develop fever, have vomiting or if the swelling and redness starts spreading , return to the emergency room immediately for a recheck.  Take your antibiotics as directed and to completion. You should never have any leftover antibiotics! Push fluids and stay well hydrated.   Any antibiotic use can reduce the efficacy of hormonal birth control. Please use back up method of contraception.   Do not hesitate to return to the emergency room for any new, worsening or concerning symptoms.  Please obtain primary care using resource guide below. Let them know that you were seen in the emergency room and that they will need to obtain records for further outpatient management.    Incision and Drainage Incision and drainage is a procedure in which a sac-like structure (cystic structure) is opened and drained. The area to be drained usually contains material such as pus, fluid, or blood.  LET YOUR CAREGIVER KNOW ABOUT:   Allergies to medicine.  Medicines taken, including vitamins, herbs, eyedrops, over-the-counter medicines, and creams.  Use of steroids (by mouth or creams).  Previous problems with anesthetics or numbing medicines.  History of bleeding problems or blood clots.  Previous surgery.  Other health problems, including diabetes and kidney problems.  Possibility of pregnancy, if this applies. RISKS AND COMPLICATIONS  Pain.  Bleeding.  Scarring.  Infection. BEFORE THE PROCEDURE  You may need to have an ultrasound or other imaging tests to see how large or deep your cystic structure is. Blood tests may also be used to determine if you have an infection or how severe the infection is. You may need to have a tetanus shot. PROCEDURE  The affected area is cleaned with a cleaning fluid. The cyst area will then be numbed with a medicine (local anesthetic). A small incision will be made in the  cystic structure. A syringe or catheter may be used to drain the contents of the cystic structure, or the contents may be squeezed out. The area will then be flushed with a cleansing solution. After cleansing the area, it is often gently packed with a gauze or another wound dressing. Once it is packed, it will be covered with gauze and tape or some other type of wound dressing. AFTER THE PROCEDURE   Often, you will be allowed to go home right after the procedure.  You may be given antibiotic medicine to prevent or heal an infection.  If the area was packed with gauze or some other wound dressing, you will likely need to come back in 1 to 2 days to get it removed.  The area should heal in about 14 days.   This information is not intended to replace advice given to you by your health care provider. Make sure you discuss any questions you have with your health care provider.   Document Released: 12/27/2000 Document Revised: 01/02/2012 Document Reviewed: 08/28/2011 Elsevier Interactive Patient Education [employer]  Emergency Department Resource Guide 1) Find a Doctor and Pay Out of Pocket Although you won't have to find out who is covered by your insurance plan, it is a good idea to ask around and get recommendations. You will then need to call the office and see if the doctor you have chosen will accept you as a new patient and what types of options they offer for patients who are self-pay. Some doctors offer discounts or will  set up payment plans for their patients who do not have insurance, but you will need to ask so you aren't surprised when you get to your appointment.  2) Contact Your Local Health Department Not all health departments have doctors that can see patients for sick visits, but many do, so it is worth a call to see if yours does. If you don't know where your local health department is, you can check in your phone book. The CDC also has a tool to help you locate your  state's health department, and many state websites also have listings of all of their local health departments.  3) Find a Walk-in Clinic If your illness is not likely to be very severe or complicated, you may want to try a walk in clinic. These are popping up all over the country in pharmacies, drugstores, and shopping centers. They're usually staffed by nurse practitioners or physician assistants that have been trained to treat common illnesses and complaints. They're usually fairly quick and inexpensive. However, if you have serious medical issues or chronic medical problems, these are probably not your best option.  No Primary Care Doctor: - Call Health Connect at  [phone] - they can help you locate a primary care doctor that  accepts your insurance, provides certain services, etc. - Physician Referral Service- [phone]  Chronic Pain Problems: Organization         Address  Phone   Notes  [facility]  [phone] Patients need to be referred by their primary care doctor.   Medication Assistance: Organization         Address  Phone   Notes  [facility] Medication [facility] [address] [place] [place], [place] [address] [phone] --Must be a resident of [facility] -- Must have NO insurance coverage whatsoever (no Medicaid/ Medicare, etc.) -- The pt. MUST have a primary care doctor that directs their care regularly and follows them in the community   [facility]ist  [phone]   [employer]  [phone]    Agencies that provide inexpensive medical care: Organization         Address  Phone   Notes  [facility]  [phone]   [facility]    [phone]   [facility] [address] [place], [place] [address] [phone]   [facility] [address] [place] [place] [phone]   Planned Parenthood    [phone]   [facility]    [phone]   [facility]  [address], [place] Phone:  [phone], Fax:  [phone] Hours of Operation:  9 am - 6 pm, M-F.  Also accepts Medicaid/Medicare and self-pay.  [facility]  [address], [place] Phone: [phone], Fax: [phone]. Hours of Operation:  8:30 am - 5:30 pm, M-F.  Also accepts Medicaid and self-pay.  [facility] [place] [address] [place] Phone: [phone]   [facility] [address] Natasha Bence [place], [place] [phone], Ext. [phone] Mondays & Thursdays: 7-9 AM.  First 15 patients are seen on a first come, first serve basis.    Medicaid-accepting [facility] Providers:  Organization         Address  Phone   Notes  [facility] [address],  [phone]  Also accepts self-pay patients.  [facility] [address], [place]  [phone]   [facility] [address], [place] [phone]) [phone]   [facility] [address] [place] [address], [place] [phone]   Renaye RakersVeita Bland [address], [place]   [phone] Only accepts [place] Access [place] patients after they have their name applied to their card.   Self-Pay (no insurance) in [facility]:  Organization         Address  Phone   Notes  Sickle Cell Patients, [facility] [address], [place] [phone]) [phone]   [facility] [address], [place] [phone]   [facility]  [address], [place] [phone]   [facility]/Dr. Osei-Bonsu  [address] [place] [address], [place] or [phone], [place] [phone] Phone number for both [place] and [place] locations is the same.  [facility] [address], [place] [phone])  [phone]   [facility] [address] [place] [address], [place] or [address] [phone]   [facility] [address], [place] [phone]) [phone], phone; [phone]) [phone], fax Sees patients 1st and 3rd Saturday of every month.  Must not qualify for public or private insurance (i.e. Medicaid, Medicare, [place] Health Choice, Veterans' Benefits)  Household income should be no more than 200% of the poverty level The clinic cannot treat you if you are pregnant or think you are pregnant  Sexually transmitted diseases are not treated at the clinic.    Dental Care: Organization         Address  Phone  Notes  [facility] [address], [place] [phone] Accepts children up to age 521 who are enrolled in [place] or [place] Health Choice; pregnant women with a Medicaid card; and children who have applied for Medicaid or [place] Health Choice, but were declined, whose parents can pay a reduced fee at time of service.  [facility] [place]  [address], [place] [phone] Accepts children up to age 30 who are enrolled in [place] or [place] Health Choice; pregnant women with a Medicaid card; and children who have applied for Medicaid or [place] Health Choice, but were declined, whose parents can pay a reduced fee at time of service.  [facility]  [address], [place] [phone] Patients are seen by appointment only. Walk-ins are not accepted. [facility] will see patients 30 years of age and older. Monday - Tuesday (8am-5pm) Most Wednesdays (8:30-5pm) $30 per visit, cash only  [facility]  [address], [place] [facility] [phone] Patients are seen by appointment only. Walk-ins are not accepted. [facility] will see patients 518 years of age and older. One Wednesday Evening (Monthly: Volunteer  Based).  $30 per visit, cash only  [facility]  [phone]) [phone] for adults; Children under age 684, call Graduate Pediatric Dentistry at [phone]) [phone]. Children aged 834-14, please call [phone] to request a pediatric application.  Dental services are provided in all areas of dental care including fillings, crowns and bridges, complete and partial dentures, implants, gum treatment, root canals, and extractions. Preventive care is also provided. Treatment  is provided to both adults and children. Patients are selected via a lottery and there is often a waiting list.   [facility] [address], [place]  [phone] [email or web address]   [facility] [address] [place], [place] [phone], Ext. [phone] Second and Fourth Thursday of each month, opens at 6:30 AM; Clinic ends at 9 AM.  Patients are seen on a first-come first-served basis, and a limited number are seen during each clinic.   [facility]  [address] Ether Griffins [place], [place] [phone]   Eligibility Requirements You must have lived in [place], [place], or [place] counties for at least the last three months.   You cannot be eligible for state or federal sponsored [place], including [employer], [place], or [employer].   You generally cannot be eligible for healthcare insurance through your employer.    How to apply: Eligibility screenings are held every Tuesday and Wednesday afternoon from 1:00 pm until 4:00 pm. You do not need an appointment for the interview!  [facility] [address], [place], [place] [phone]   [facility] Health Department  [phone]   [facility]  [phone]   [facility] Health Department  [phone]    Behavioral Health Resources in the Community: Intensive Outpatient Programs Organization         Address  Phone  Notes  [facility]  Services [address] [place], [place] [phone]   [facility] [address], [place], [place] [phone]   [facility] [address] [place], [place]  [phone]   [facility] Mental Health [address]  [place], [place] [phone] or [phone]   Substance Abuse Resources Organization         Address  Phone  Notes  [facility] Drug Services  [phone]   [facility]  [phone]   [facility] [place]  [phone]   Floydene Flock  [phone]   Residential & Outpatient Substance Abuse Program  [phone]   Psychological Services Organization         Address  Phone  Notes  [facility]al Health  [phone]   [facility] Services  [phone]   [facility] Mental Health [address] [place] [phone] or [phone]    Mobile Crisis Teams Organization         Address  Phone  Notes  [facility], Mobile Crisis Care Unit  [phone]   [facility]  [address] [place], [place] [phone]   Doristine Locks [address] [place] [phone]    Self-Help/Support Groups Organization         Address  Phone             Notes  [facility] [place] - variety of support groups  [phone]- I7437963 Call for more information  Narcotics Anonymous (NA), [facility] [address], [employer] [place]  2 meetings at this location   Statistician         Address  Phone  Notes  [facility] [address],    [place]  [phone]   [facility]  [address], [place] [address], [place], [place] [phone]   [facility] [address] [place], [place] [phone] Admissions: 8am-3pm M-F  [facility] [address]    [place], [place]  [phone]   [facility] [address] [place], [place] [phone]    [facility] [address],  [place], [place] [phone]   [facility] - Intensive Outpatient [address], [place], [place] [phone]   [facility] ([facility] [address] [place].,  [place], [place] [phone] or [phone]   [facility] ([facility]) [address], [place], [place] [phone] Accepts Medicaid  [facility] [address]  [place] [phone] Substance Abuse/Addiction Treatment   [facility] Organization         Address  Phone  Notes  [facility]  [phone]   Angie Fava, PhD [address] [place] [address] Ervin Knack [place], [place]   [phone] or [phone]   [facility] Behavioral   [address] [place], [place] [phone]   [facility] [address], [place], [place] [phone] Insurance/Medicaid/sponsorship through [facility] and Families [address]                                    [place], [place] [phone] Therapy/tele-psych/case  [facility] [address] [place], [place] [phone]    Dr. Lolly Mustache  [phone]   [facility]  [employer] [facility] Dept. 1) [address]  2) [address], [place] 3)  [address], [place] [phone]  [phone]   [facility] Child Abuse Hotline (231) 323-7387 or [phone] (After Hours)

## 2015-07-18 NOTE — ED Notes (Addendum)
She c/o 1 week history increasingly swollen, painful, red mass to R side of her neck. She denies any noted activity at onset. She reports sweats, chills and nausea for past 2 days. She denies any current substance use but does report a history of substance use, appears unkempt today, alert and breathing easily

## 2015-07-18 NOTE — ED Notes (Signed)
Called and no answer also checked sub waiting. 

## 2015-07-18 NOTE — ED Provider Notes (Signed)
CSN: [id number]     Arrival date & time 07/18/15  1226 History   First MD Initiated Contact with Patient 07/18/15 1258     Chief Complaint  Patient presents with  . Skin Problem     (Consider location/radiation/quality/duration/timing/severity/associated sxs/prior Treatment) HPI   Blood pressure 117/72, pulse 99, temperature 98 F (36.7 C), temperature source Oral, resp. rate 16, height 5\' 4"  (1.626 m), weight 62.681 kg, last menstrual period 07/04/2015, SpO2 98 %.  Karen Cline is a 30 y.o. female with past medical history significant for alcohol abuse, polysubstance abuse and seizures complaining of painful swelling to right neck worsening over the course of one week she has chills with a single episode of nonbloody, nonbilious, no coffee-ground emesis today. She denies injection drug use in the last 6 months, denies any trauma to the area preceding the swelling. She's never had a similar episode on the neck she does have history of smaller abscesses. She denies fever, shortness of breath, drainage from the area.  Past Medical History  Diagnosis Date  . Alcohol abuse   . Polysubstance abuse     Xanax, Heroin  . GERD (gastroesophageal reflux disease)   . Seizures (HCC)   . Neuromuscular disorder (HCC)     Nerve pain Left ankle   Past Surgical History  Procedure Laterality Date  . Ankle fracture surgery      history restored after error with record merge      . Wrist surgery Left 2012    tendon surgery  . Cesarean section  2009, 2012  . Open reduction internal fixation (orif) distal radial fracture Left 03/17/2015    Procedure: OPEN REDUCTION INTERNAL FIXATION (ORIF) DISTAL RADIAL FRACTURE;  Surgeon: Mack Hookavid Thompson, MD;  [facility];  Service: Orthopedics;  Laterality: Left;   History reviewed. No pertinent family history. Social History  Substance Use Topics  . Smoking status: Current Every Day Smoker -- 0.50 packs/day for 7 years    Types: Cigarettes  . Smokeless  tobacco: Never Used  . Alcohol Use: Yes     Comment: ETOH abuse   OB History    No data available     Review of Systems  10 systems reviewed and found to be negative, except as noted in the HPI.   Allergies  Tramadol and Ultram  Home Medications   Prior to Admission medications   Medication Sig Start Date End Date Taking? Authorizing Provider  cyclobenzaprine (FLEXERIL) 10 MG tablet Take 1 tablet (10 mg total) by mouth 2 (two) times daily as needed for muscle spasms. 03/11/15   Danelle BerryLeisa Tapia, PA-C  HYDROcodone-acetaminophen (NORCO) 5-325 MG per tablet Take 1 tablet by mouth every 4 (four) hours as needed for severe pain. 03/15/15   Loren Raceravid Yelverton, MD  ibuprofen (ADVIL,MOTRIN) 800 MG tablet Take 1 tablet (800 mg total) by mouth 3 (three) times daily. 03/11/15   Danelle BerryLeisa Tapia, PA-C  oxyCODONE-acetaminophen (PERCOCET) 5-325 MG per tablet Take 1-2 tablets by mouth every 6 (six) hours as needed for severe pain. 03/17/15   Mack Hookavid Thompson, MD  pantoprazole (PROTONIX) 40 MG tablet Take 40 mg by mouth daily.    Historical Provider, MD   BP 117/72 mmHg  Pulse 99  Temp(Src) 98 F (36.7 C) (Oral)  Resp 16  Ht 5\' 4"  (1.626 m)  Wt 62.681 kg  BMI 23.71 kg/m2  SpO2 98%  LMP 07/04/2015 Physical Exam  Constitutional: She is oriented to person, place, and time. She appears well-developed and well-nourished. No  distress.  HENT:  Head: Normocephalic and atraumatic.  Eyes: Conjunctivae and EOM are normal.  Neck: Normal range of motion.  10 cm fluctuant abscess to right inferior neck as pictured, minimal surrounding cellulitis, no drainage, nonpulsatile.   Cardiovascular: Normal rate, regular rhythm and intact distal pulses.   Pulmonary/Chest: Effort normal and breath sounds normal. No stridor. No respiratory distress. She has no wheezes. She has no rales. She exhibits no tenderness.  Abdominal: Soft. Bowel sounds are normal. She exhibits no distension and no mass. There is no tenderness. There is no  rebound and no guarding.  Musculoskeletal: Normal range of motion.  Neurological: She is alert and oriented to person, place, and time.  Psychiatric: She has a normal mood and affect.  Nursing note and vitals reviewed.       ED Course  .Marland KitchenIncision and Drainage Date/Time: 07/18/2015 2:45 PM Performed by: Wynetta Emery Authorized by: Wynetta Emery Consent: Verbal consent obtained. Consent given by: patient Patient identity confirmed: verbally with patient Type: abscess Body area: neck Location details: right anterior neck Anesthesia: local infiltration Local anesthetic: lidocaine 1% without epinephrine and LET (lido,epi,tetracaine) Anesthetic total: 5 ml Patient sedated: no Risk factor: underlying major vessel and  underlying major nerve Scalpel size: 11 Incision type: single straight Incision depth: dermal Complexity: complex Drainage: purulent Drainage amount: copious Wound treatment: wound left open Packing material: 1/2 in iodoform gauze Patient tolerance: Patient tolerated the procedure well with no immediate complications   EMERGENCY DEPARTMENT US SOFT TISSUE INTERPRETATION "Study: Limited Ultrasound of the noted body part in comments below"  INDICATIONS: Soft tissue infection Multiple views of the body part are obtained with a multi-frequency linear probe  PERFORMED BY:  Myself  IMAGES ARCHIVED?: Yes  SIDE:Right   BODY PART:Neck  FINDINGS: Abcess present  INTERPRETATION:  Abcess present  COMMENT:  Abscess not adjacent to major blood vessel    Labs Review Labs Reviewed - No data to display  Imaging Review No results found. I have personally reviewed and evaluated these images and lab results as part of my medical decision-making.   EKG Interpretation None      MDM   Final diagnoses:  Abscess    Filed Vitals:   07/18/15 1230  BP: 117/72  Pulse: 99  Temp: 98 F (36.7 C)  TempSrc: Oral  Resp: 16  Height: 5\' 4"  (1.626 m)    Weight: 62.681 kg  SpO2: 98%    Medications  ibuprofen (ADVIL,MOTRIN) tablet 600 mg (600 mg Oral Given 07/18/15 1347)  lidocaine-EPINEPHrine-tetracaine (LET) solution (3 mLs Topical Given 07/18/15 1347)  lidocaine (PF) (XYLOCAINE) 1 % injection 5 mL (5 mLs Intradermal Given 07/18/15 1347)  sulfamethoxazole-trimethoprim (BACTRIM DS,SEPTRA DS) 800-160 MG per tablet 1 tablet (1 tablet Oral Given 07/18/15 1351)    Karen Cline is 30 y.o. female presenting with large abscess to right neck, does not appear to be traveling deeply. She is afebrile with stable vital signs, single episode of emesis this a.m. No shortness of breath, no wheezing. Ultrasound performed, focal abscess noted it is not directly adjacent to nature vessel., Wound is irrigated and packed with half-inch gauze, extensive discussion of wound care patient will return for packing removal in 2 days.  Discussed case with attending physician who agrees with care plan of I and D.   Evaluation does not show pathology that would require ongoing emergent intervention or inpatient treatment. Pt is hemodynamically stable and mentating appropriately. Discussed findings and plan with patient/guardian, who agrees with care plan.  All questions answered. Return precautions discussed and outpatient follow up given.   New Prescriptions   SULFAMETHOXAZOLE-TRIMETHOPRIM (BACTRIM DS) 800-160 MG TABLET    Take 1 tablet by mouth 2 (two) times daily.         Wynetta Emery, PA-C 07/18/15 1450  Cathren Laine, MD 07/20/15 [phone]

## 2015-07-22 ENCOUNTER — Emergency Department (HOSPITAL_COMMUNITY)
Admission: EM | Admit: 2015-07-22 | Discharge: 2015-07-22 | Disposition: A | Discharge status: Home / Self Care | Payer: Medicaid Other | Attending: Emergency Medicine | Admitting: Emergency Medicine

## 2015-07-22 ENCOUNTER — Encounter (HOSPITAL_COMMUNITY): Payer: Self-pay | Admitting: *Deleted

## 2015-07-22 DIAGNOSIS — Z792 Long term (current) use of antibiotics: Secondary | ICD-10-CM | POA: Insufficient documentation

## 2015-07-22 DIAGNOSIS — K219 Gastro-esophageal reflux disease without esophagitis: Secondary | ICD-10-CM | POA: Insufficient documentation

## 2015-07-22 DIAGNOSIS — Z8669 Personal history of other diseases of the nervous system and sense organs: Secondary | ICD-10-CM | POA: Insufficient documentation

## 2015-07-22 DIAGNOSIS — L0211 Cutaneous abscess of neck: Secondary | ICD-10-CM | POA: Insufficient documentation

## 2015-07-22 DIAGNOSIS — R6883 Chills (without fever): Secondary | ICD-10-CM | POA: Insufficient documentation

## 2015-07-22 DIAGNOSIS — F17210 Nicotine dependence, cigarettes, uncomplicated: Secondary | ICD-10-CM | POA: Insufficient documentation

## 2015-07-22 DIAGNOSIS — Z79899 Other long term (current) drug therapy: Secondary | ICD-10-CM | POA: Insufficient documentation

## 2015-07-22 DIAGNOSIS — R110 Nausea: Secondary | ICD-10-CM | POA: Insufficient documentation

## 2015-07-22 NOTE — ED Notes (Addendum)
Pt states she had an abscess to her R neck packed 3 days prior and was told to come here today to have the packing removed. Wound has foul odor.

## 2015-07-22 NOTE — Discharge Instructions (Signed)
Please read and follow all provided instructions.  Your diagnoses today include:  1. Abscess of neck    Tests performed today include:  Vital signs. See below for your results today.   Medications prescribed:   None  Take any prescribed medications only as directed.   Home care instructions:   Follow any educational materials contained in this packet  Follow-up instructions: Return to the Emergency Department in 48 hours for a recheck if your symptoms are not significantly improved.  Return instructions:  Return to the Emergency Department if you have:  Fever  Worsening symptoms  Worsening pain  Worsening swelling  Redness of the skin that moves away from the affected area, especially if it streaks away from the affected area   Any other emergent concerns   Your vital signs today were: BP 119/70 mmHg   Pulse 76   Temp(Src) 98.7 F (37.1 C) (Oral)   Resp 18   SpO2 98%   LMP 07/04/2015 If your blood pressure (BP) was elevated above 135/85 this visit, please have this repeated by your doctor within one month. --------------

## 2015-07-22 NOTE — ED Notes (Signed)
Gave pt bus pass and bag lunch.

## 2015-07-22 NOTE — ED Provider Notes (Signed)
CSN: [id number]     Arrival date & time 07/22/15  1723 History  By signing my name below, I, Freida Busmaniana Omoyeni, attest that this documentation has been prepared under the direction and in the presence of non-physician practitioner, Rhea BleacherJosh Xander Jutras PA-C. Electronically Signed: Freida Busmaniana Omoyeni, Scribe. 07/22/2015. 6:18 PM.    Chief Complaint  Patient presents with  . Wound Check   The history is provided by the patient. No language interpreter was used.    HPI Comments:  Karen Cline is a 30 y.o. female who presents to the Emergency Department for wound check. She was evaluated in the ED for abscess to her right neck, which she had drained and packed . Pt returned to have packing removed. Pt notes she has felt better since discharge. She reports mild pain to the site with associated nausea and chills at this time. She denies fever and h/o abscess.  Past Medical History  Diagnosis Date  . Alcohol abuse   . Polysubstance abuse     Xanax, Heroin  . GERD (gastroesophageal reflux disease)   . Seizures (HCC)   . Neuromuscular disorder (HCC)     Nerve pain Left ankle   Past Surgical History  Procedure Laterality Date  . Ankle fracture surgery      history restored after error with record merge      . Wrist surgery Left 2012    tendon surgery  . Cesarean section  2009, 2012  . Open reduction internal fixation (orif) distal radial fracture Left 03/17/2015    Procedure: OPEN REDUCTION INTERNAL FIXATION (ORIF) DISTAL RADIAL FRACTURE;  Surgeon: Mack Hookavid Thompson, MD;  [facility];  Service: Orthopedics;  Laterality: Left;   No family history on file. Social History  Substance Use Topics  . Smoking status: Current Every Day Smoker -- 0.50 packs/day for 7 years    Types: Cigarettes  . Smokeless tobacco: Never Used  . Alcohol Use: Yes     Comment: ETOH abuse   OB History    No data available     Review of Systems  Constitutional: Positive for chills. Negative for fever.  Gastrointestinal:  Positive for nausea. Negative for vomiting.  Skin: Positive for wound (right neck). Negative for color change.       Positive for abscess.  Hematological: Negative for adenopathy.    Allergies  Tramadol and Ultram  Home Medications   Prior to Admission medications   Medication Sig Start Date End Date Taking? Authorizing Provider  cyclobenzaprine (FLEXERIL) 10 MG tablet Take 1 tablet (10 mg total) by mouth 2 (two) times daily as needed for muscle spasms. 03/11/15   Danelle BerryLeisa Tapia, PA-C  HYDROcodone-acetaminophen (NORCO) 5-325 MG per tablet Take 1 tablet by mouth every 4 (four) hours as needed for severe pain. 03/15/15   Loren Raceravid Yelverton, MD  ibuprofen (ADVIL,MOTRIN) 800 MG tablet Take 1 tablet (800 mg total) by mouth 3 (three) times daily. 03/11/15   Danelle BerryLeisa Tapia, PA-C  oxyCODONE-acetaminophen (PERCOCET) 5-325 MG per tablet Take 1-2 tablets by mouth every 6 (six) hours as needed for severe pain. 03/17/15   Mack Hookavid Thompson, MD  pantoprazole (PROTONIX) 40 MG tablet Take 40 mg by mouth daily.    Historical Provider, MD  sulfamethoxazole-trimethoprim (BACTRIM DS) 800-160 MG tablet Take 1 tablet by mouth 2 (two) times daily. 07/18/15   Nicole Pisciotta, PA-C   BP 119/70 mmHg  Pulse 76  Temp(Src) 98.7 F (37.1 C) (Oral)  Resp 18  SpO2 98%  LMP 07/04/2015   Physical  Exam  Constitutional: She appears well-developed and well-nourished.  HENT:  Head: Normocephalic and atraumatic.  Eyes: Conjunctivae are normal.  Neck: Normal range of motion. Neck supple.  Pulmonary/Chest: No respiratory distress.  Neurological: She is alert.  Skin: Skin is warm and dry.  Right neck abscess with packing in place. Mild surrounding erythema with mild tenderness. Mild active drainage of purulent material. Overall, appears to be resolving appropriately.  Psychiatric: She has a normal mood and affect.  Nursing note and vitals reviewed.   ED Course  Procedures   DIAGNOSTIC STUDIES:  Oxygen Saturation is 98% on RA,  normal by my interpretation.    COORDINATION OF CARE:  6:16 PM Pt advised to complete course of bactrim and apply warm compresses to the site. Return precautions given. Discussed treatment plan with pt at bedside and pt agreed to plan.  Vital signs reviewed and are as follows: Filed Vitals:   07/22/15 1754  BP: 119/70  Pulse: 76  Temp: 98.7 F (37.1 C)  Resp: 18   Packing removal performed by myself without complication.  The patient was urged to return to the Emergency Department urgently with worsening pain, swelling, expanding erythema especially if it streaks away from the affected area, fever, or if they have any other concerns.   The patient was urged to return to the Emergency Department or go to their PCP in 48 hours for wound recheck if the area is not significantly improved.  The patient verbalized understanding and stated agreement with this plan.    MDM   Final diagnoses:  Abscess of neck   Patient with improving right-sided neck abscess. No systemic symptoms of illness. She is compliant with her antibiotics. Packing removed without complication. Wound is open without need for repacking. Treatment and return instructions as above.  I personally performed the services described in this documentation, which was scribed in my presence. The recorded information has been reviewed and is accurate.    Renne Crigler, PA-C 07/22/15 2018  Zadie Rhine, MD 07/23/15 [phone]

## 2015-08-20 ENCOUNTER — Emergency Department (HOSPITAL_COMMUNITY)
Admission: EM | Admit: 2015-08-20 | Discharge: 2015-08-20 | Disposition: A | Discharge status: Home / Self Care | Payer: Self-pay | Attending: Emergency Medicine | Admitting: Emergency Medicine

## 2015-08-20 ENCOUNTER — Emergency Department (HOSPITAL_COMMUNITY): Payer: Self-pay

## 2015-08-20 ENCOUNTER — Emergency Department (HOSPITAL_COMMUNITY): Payer: Medicaid Other

## 2015-08-20 ENCOUNTER — Encounter (HOSPITAL_COMMUNITY): Payer: Self-pay | Admitting: Emergency Medicine

## 2015-08-20 DIAGNOSIS — Z8669 Personal history of other diseases of the nervous system and sense organs: Secondary | ICD-10-CM | POA: Insufficient documentation

## 2015-08-20 DIAGNOSIS — Y9289 Other specified places as the place of occurrence of the external cause: Secondary | ICD-10-CM | POA: Insufficient documentation

## 2015-08-20 DIAGNOSIS — S0081XA Abrasion of other part of head, initial encounter: Secondary | ICD-10-CM | POA: Insufficient documentation

## 2015-08-20 DIAGNOSIS — F1010 Alcohol abuse, uncomplicated: Secondary | ICD-10-CM

## 2015-08-20 DIAGNOSIS — F17210 Nicotine dependence, cigarettes, uncomplicated: Secondary | ICD-10-CM | POA: Insufficient documentation

## 2015-08-20 DIAGNOSIS — Y9389 Activity, other specified: Secondary | ICD-10-CM | POA: Insufficient documentation

## 2015-08-20 DIAGNOSIS — K219 Gastro-esophageal reflux disease without esophagitis: Secondary | ICD-10-CM | POA: Insufficient documentation

## 2015-08-20 DIAGNOSIS — Z79899 Other long term (current) drug therapy: Secondary | ICD-10-CM | POA: Insufficient documentation

## 2015-08-20 DIAGNOSIS — Y998 Other external cause status: Secondary | ICD-10-CM | POA: Insufficient documentation

## 2015-08-20 DIAGNOSIS — F10120 Alcohol abuse with intoxication, uncomplicated: Secondary | ICD-10-CM | POA: Insufficient documentation

## 2015-08-20 DIAGNOSIS — Z23 Encounter for immunization: Secondary | ICD-10-CM | POA: Insufficient documentation

## 2015-08-20 MED ORDER — ACETAMINOPHEN 500 MG PO TABS
1000.0000 mg | ORAL_TABLET | Freq: Once | ORAL | Status: AC
  Administered 2015-08-20: 1000 mg via ORAL
  Filled 2015-08-20: qty 2

## 2015-08-20 MED ORDER — TETANUS-DIPHTH-ACELL PERTUSSIS 5-2.5-18.5 LF-MCG/0.5 IM SUSP
0.5000 mL | Freq: Once | INTRAMUSCULAR | Status: AC
  Administered 2015-08-20: 0.5 mL via INTRAMUSCULAR
  Filled 2015-08-20: qty 0.5

## 2015-08-20 NOTE — ED Notes (Signed)
Patient transported to X-ray 
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
## 2015-08-20 NOTE — Discharge Instructions (Signed)
Alcohol Abuse and Nutrition °Alcohol abuse is any pattern of alcohol consumption that harms your health, relationships, or work. Alcohol abuse can affect how your body breaks down and absorbs nutrients from food by causing your liver to work abnormally. Additionally, many people who abuse alcohol do not eat enough carbohydrates, protein, fat, vitamins, and minerals. This can cause poor nutrition (malnutrition) and a lack of nutrients (nutrient deficiencies), which can lead to further complications. °Nutrients that are commonly lacking (deficient) among people who abuse alcohol include: °· Vitamins. °· Vitamin A. This is stored in your liver. It is important for your vision, metabolism, and ability to fight off infections (immunity). °· B vitamins. These include vitamins such as folate, thiamin, and niacin. These are important in new cell growth and maintenance. °· Vitamin C. This plays an important role in iron absorption, wound healing, and immunity. °· Vitamin D. This is produced by your liver, but you can also get vitamin D from food. Vitamin D is necessary for your body to absorb and use calcium. °· Minerals. °· Calcium. This is important for your bones and your heart and blood vessel (cardiovascular) function. °· Iron. This is important for blood, muscle, and nervous system functioning. °· Magnesium. This plays an important role in muscle and nerve function, and it helps to control blood sugar and blood pressure. °· Zinc. This is important for the normal function of your nervous system and digestive system (gastrointestinal tract). °Nutrition is an essential component of therapy for alcohol abuse. Your health care provider or dietitian will work with you to design a plan that can help restore nutrients to your body and prevent potential complications. °WHAT IS MY PLAN? °Your dietitian may develop a specific diet plan that is based on your condition and any other complications you may have. A diet plan will  commonly include: °· A balanced diet. °¨ Grains: 6-8 oz per day. °¨ Vegetables: 2-3 cups per day. °¨ Fruits: 1-2 cups per day. °¨ Meat and other protein: 5-6 oz per day. °¨ Dairy: 2-3 cups per day. °· Vitamin and mineral supplements. °WHAT DO I NEED TO KNOW ABOUT ALCOHOL AND NUTRITION? °· Consume foods that are high in antioxidants, such as grapes, berries, nuts, green tea, and dark green and orange vegetables. This can help to counteract some of the stress that is placed on your liver by consuming alcohol. °· Avoid food and drinks that are high in fat and sugar. Foods such as sugared soft drinks, salty snack foods, and candy contain empty calories. This means that they lack important nutrients such as protein, fiber, and vitamins. °· Eat frequent meals and snacks. Try to eat 5-6 small meals each day. °· Eat a variety of fresh fruits and vegetables each day. This will help you get plenty of water, fiber, and vitamins in your diet. °· Drink plenty of water and other clear fluids. Try to drink at least 48-64 oz (1.5-2 L) of water per day. °· If you are a vegetarian, eat a variety of protein-rich foods. Pair whole grains with plant-based proteins at meals and snacks to obtain the greatest nutrient benefit from your food. For example, eat rice with beans, put peanut butter on whole-grain toast, or eat oatmeal with sunflower seeds. °· Soak beans and whole grains overnight before cooking. This can help your body to absorb the nutrients more easily. °· Include foods fortified with vitamins and minerals in your diet. Commonly fortified foods include milk, orange juice, cereal, and bread. °· If you   are malnourished, your dietitian may recommend a high-protein, high-calorie diet. This may include:  2,000-3,000 calories (kilocalories) per day.  70-100 grams of protein per day.  Your health care provider may recommend a complete nutritional supplement beverage. This can help to restore calories, protein, and vitamins to  your body. Depending on your condition, you may be advised to consume this instead of or in addition to meals.  Limit your intake of caffeine. Replace drinks like coffee and black tea with decaffeinated coffee and herbal tea.  Eat a variety of foods that are high in omega fatty acids. These include fish, nuts and seeds, and soybeans. These foods may help your liver to recover and may also stabilize your mood.  Certain medicines may cause changes in your appetite, taste, and weight. Work with your health care provider and dietitian to make any adjustments to your medicines and diet plan.  Include other healthy lifestyle choices in your daily routine.  Be physically active.  Get enough sleep.  Spend time doing activities that you enjoy.  If you are unable to take in enough food and calories by mouth, your health care provider may recommend a feeding tube. This is a tube that passes through your nose and throat, directly into your stomach. Nutritional supplement beverages can be given to you through the feeding tube to help you get the nutrients you need.  Take vitamin or mineral supplements as recommended by your health care provider. WHAT FOODS CAN I EAT? Grains Enriched pasta. Enriched rice. Fortified whole-grain bread. Fortified whole-grain cereal. Barley. Brown rice. Quinoa. Erwin. Vegetables All fresh, frozen, and canned vegetables. Spinach. Kale. Artichoke. Carrots. Winter squash and pumpkin. Sweet potatoes. Broccoli. Cabbage. Cucumbers. Tomatoes. Sweet peppers. Green beans. Peas. Corn. Fruits All fresh and frozen fruits. Berries. Grapes. Mango. Papaya. Guava. Cherries. Apples. Bananas. Peaches. Plums. Pineapple. Watermelon. Cantaloupe. Oranges. Avocado. Meats and Other Protein Sources Beef liver. Lean beef. Pork. Fresh and canned chicken. Fresh fish. Oysters. Sardines. Canned tuna. Shrimp. Eggs with yolks. Nuts and seeds. Peanut butter. Beans and lentils. Soybeans.  Tofu. Dairy Whole, low-fat, and nonfat milk. Whole, low-fat, and nonfat yogurt. Cottage cheese. Sour cream. Hard and soft cheeses. Beverages Water. Herbal tea. Decaffeinated coffee. Decaffeinated green tea. 100% fruit juice. 100% vegetable juice. Instant breakfast shakes. Condiments Ketchup. Mayonnaise. Mustard. Salad dressing. Barbecue sauce. Sweets and Desserts Sugar-free ice cream. Sugar-free pudding. Sugar-free gelatin. Fats and Oils Butter. Vegetable oil, flaxseed oil, olive oil, and walnut oil. Other Complete nutrition shakes. Protein bars. Sugar-free gum. The items listed above may not be a complete list of recommended foods or beverages. Contact your dietitian for more options. WHAT FOODS ARE NOT RECOMMENDED? Grains Sugar-sweetened breakfast cereals. Flavored instant oatmeal. Fried breads. Vegetables Breaded or deep-fried vegetables. Fruits Dried fruit with added sugar. Candied fruit. Canned fruit in syrup. Meats and Other Protein Sources Breaded or deep-fried meats. Dairy Flavored milks. Fried cheese curds or fried cheese sticks. Beverages Alcohol. Sugar-sweetened soft drinks. Sugar-sweetened tea. Caffeinated coffee and tea. Condiments Sugar. Honey. Agave nectar. Molasses. Sweets and Desserts Chocolate. Cake. Cookies. Candy. Other Potato chips. Pretzels. Salted nuts. Candied nuts. The items listed above may not be a complete list of foods and beverages to avoid. Contact your dietitian for more information.   This information is not intended to replace advice given to you by your health care provider. Make sure you discuss any questions you have with your health care provider.   Document Released: 04/27/2005 Document Revised: 07/24/2014 Document Reviewed: 02/03/2014 Elsevier Interactive Patient  Education 2016 [employer].  General Assault Assault includes any behavior or physical attack--whether it is on purpose or not--that results in injury to another person,  damage to property, or both. This also includes assault that has not yet happened, but is planned to happen. Threats of assault may be physical, verbal, or written. They may be said or sent by:  Mail.  E-mail.  Text.  Social media.  Fax. The threats may be direct, implied, or understood. WHAT ARE THE DIFFERENT FORMS OF ASSAULT? Forms of assault include:  Physically assaulting a person. This includes physical threats to inflict physical harm as well as:  Slapping.  Hitting.  Poking.  Kicking.  Punching.  Pushing.  Sexually assaulting a person. Sexual assault is any sexual activity that a person is forced, threatened, or coerced to participate in. It may or may not involve physical contact with the person who is assaulting you. You are sexually assaulted if you are forced to have sexual contact of any kind.  Damaging or destroying a person's assistive equipment, such as glasses, canes, or walkers.  Throwing or hitting objects.  Using or displaying a weapon to harm or threaten someone.  Using or displaying an object that appears to be a weapon in a threatening manner.  Using greater physical size or strength to intimidate someone.  Making intimidating or threatening gestures.  Bullying.  Hazing.  Using language that is intimidating, threatening, hostile, or abusive.  Stalking.  Restraining someone with force. WHAT SHOULD I DO IF I EXPERIENCE ASSAULT?  Report assaults, threats, and stalking to the police. Call your local emergency services (911 in the [place] if you are in immediate danger or you need medical help.  You can work with a Clinical research associate or an advocate to get legal protection against someone who has assaulted you or threatened you with assault. Protection includes restraining orders and private addresses. Crimes against you, such as assault, can also be prosecuted through the courts. Laws will vary depending on where you live.   This information is not  intended to replace advice given to you by your health care provider. Make sure you discuss any questions you have with your health care provider.   Document Released: 07/03/2005 Document Revised: 07/24/2014 Document Reviewed: 03/20/2014 Elsevier Interactive Patient Education [employer]

## 2015-08-20 NOTE — ED Notes (Signed)
PT yelling "I REFUSE ALL TREATMENT"
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
## 2015-11-04 ENCOUNTER — Emergency Department (HOSPITAL_COMMUNITY)
Admission: EM | Admit: 2015-11-04 | Discharge: 2015-11-04 | Disposition: A | Discharge status: Home / Self Care | Payer: Self-pay | Attending: Emergency Medicine | Admitting: Emergency Medicine

## 2015-11-04 ENCOUNTER — Encounter (HOSPITAL_COMMUNITY): Payer: Self-pay | Admitting: Emergency Medicine

## 2015-11-04 ENCOUNTER — Emergency Department (HOSPITAL_COMMUNITY): Payer: Self-pay

## 2015-11-04 DIAGNOSIS — Y9389 Activity, other specified: Secondary | ICD-10-CM | POA: Insufficient documentation

## 2015-11-04 DIAGNOSIS — Z23 Encounter for immunization: Secondary | ICD-10-CM | POA: Insufficient documentation

## 2015-11-04 DIAGNOSIS — Y990 Civilian activity done for income or pay: Secondary | ICD-10-CM | POA: Insufficient documentation

## 2015-11-04 DIAGNOSIS — Z8669 Personal history of other diseases of the nervous system and sense organs: Secondary | ICD-10-CM | POA: Insufficient documentation

## 2015-11-04 DIAGNOSIS — S0081XA Abrasion of other part of head, initial encounter: Secondary | ICD-10-CM | POA: Insufficient documentation

## 2015-11-04 DIAGNOSIS — W1839XA Other fall on same level, initial encounter: Secondary | ICD-10-CM | POA: Insufficient documentation

## 2015-11-04 DIAGNOSIS — Z3202 Encounter for pregnancy test, result negative: Secondary | ICD-10-CM | POA: Insufficient documentation

## 2015-11-04 DIAGNOSIS — R569 Unspecified convulsions: Secondary | ICD-10-CM | POA: Insufficient documentation

## 2015-11-04 DIAGNOSIS — Y9289 Other specified places as the place of occurrence of the external cause: Secondary | ICD-10-CM | POA: Insufficient documentation

## 2015-11-04 DIAGNOSIS — F17210 Nicotine dependence, cigarettes, uncomplicated: Secondary | ICD-10-CM | POA: Insufficient documentation

## 2015-11-04 DIAGNOSIS — Z8719 Personal history of other diseases of the digestive system: Secondary | ICD-10-CM | POA: Insufficient documentation

## 2015-11-04 LAB — I-STAT CHEM 8, ED
BUN: 12 mg/dL (ref 6–20)
CHLORIDE: 104 mmol/L (ref 101–111)
Calcium, Ion: 1.15 mmol/L (ref 1.12–1.23)
Creatinine, Ser: 0.8 mg/dL (ref 0.44–1.00)
GLUCOSE: 121 mg/dL — AB (ref 65–99)
HCT: 35 % — ABNORMAL LOW (ref 36.0–46.0)
Hemoglobin: 11.9 g/dL — ABNORMAL LOW (ref 12.0–15.0)
POTASSIUM: 3.2 mmol/L — AB (ref 3.5–5.1)
Sodium: 142 mmol/L (ref 135–145)
TCO2: 26 mmol/L (ref 0–100)

## 2015-11-04 LAB — RAPID URINE DRUG SCREEN, HOSP PERFORMED
Amphetamines: NOT DETECTED
BENZODIAZEPINES: NOT DETECTED
Barbiturates: NOT DETECTED
COCAINE: NOT DETECTED
OPIATES: NOT DETECTED
TETRAHYDROCANNABINOL: NOT DETECTED

## 2015-11-04 LAB — CBC WITH DIFFERENTIAL/PLATELET
BASOS ABS: 0 10*3/uL (ref 0.0–0.1)
Basophils Relative: 0 %
EOS PCT: 2 %
Eosinophils Absolute: 0.2 10*3/uL (ref 0.0–0.7)
HEMATOCRIT: 32.4 % — AB (ref 36.0–46.0)
HEMOGLOBIN: 10.6 g/dL — AB (ref 12.0–15.0)
LYMPHS ABS: 2.8 10*3/uL (ref 0.7–4.0)
LYMPHS PCT: 33 %
MCH: 30.3 pg (ref 26.0–34.0)
MCHC: 32.7 g/dL (ref 30.0–36.0)
MCV: 92.6 fL (ref 78.0–100.0)
Monocytes Absolute: 0.8 10*3/uL (ref 0.1–1.0)
Monocytes Relative: 9 %
NEUTROS ABS: 4.8 10*3/uL (ref 1.7–7.7)
NEUTROS PCT: 56 %
PLATELETS: 226 10*3/uL (ref 150–400)
RBC: 3.5 MIL/uL — AB (ref 3.87–5.11)
RDW: 13.2 % (ref 11.5–15.5)
WBC: 8.6 10*3/uL (ref 4.0–10.5)

## 2015-11-04 LAB — I-STAT BETA HCG BLOOD, ED (MC, WL, AP ONLY): I-stat hCG, quantitative: <5 m[IU]/mL (ref <5)

## 2015-11-04 LAB — ETHANOL

## 2015-11-04 MED ORDER — POTASSIUM CHLORIDE CRYS ER 20 MEQ PO TBCR
40.0000 meq | EXTENDED_RELEASE_TABLET | Freq: Once | ORAL | Status: AC
  Administered 2015-11-04: 40 meq via ORAL
  Filled 2015-11-04: qty 2

## 2015-11-04 MED ORDER — LEVETIRACETAM 500 MG PO TABS: 500.0000 mg | ORAL_TABLET | Freq: Two times a day (BID) | ORAL | Status: AC

## 2015-11-04 MED ORDER — TETANUS-DIPHTH-ACELL PERTUSSIS 5-2.5-18.5 LF-MCG/0.5 IM SUSP
0.5000 mL | Freq: Once | INTRAMUSCULAR | Status: AC
  Administered 2015-11-04: 0.5 mL via INTRAMUSCULAR
  Filled 2015-11-04: qty 0.5

## 2015-11-04 MED ORDER — LEVETIRACETAM 500 MG/5ML IV SOLN
1000.0000 mg | Freq: Once | INTRAVENOUS | Status: AC
  Administered 2015-11-04: 1000 mg via INTRAVENOUS
  Filled 2015-11-04: qty 5
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
## 2015-11-04 NOTE — ED Notes (Signed)
Pt reports 2 episodes of seizure today, states she was on medication about 3 years ago then stopped. Pt has not had seizure since then. Bruises noted on left face. Pt also bitten her tong. Pt states she feels foggy. Alerts and oriented x4 at this time.
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
## 2015-11-04 NOTE — Discharge Instructions (Signed)
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
## 2023-09-12 ENCOUNTER — Ambulatory Visit: Payer: MEDICAID

## 2023-09-12 NOTE — Progress Notes (Unsigned)
 Returning patient. Privacy practices reviewed, HIPAA form signed. 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 Pt encouraged to use safe sex practices. Condoms given

## 2023-10-10 ENCOUNTER — Ambulatory Visit: Payer: MEDICAID

## 2023-10-10 NOTE — Progress Notes (Unsigned)
 Returning patient. Pt requests assistance with establishing care at [facility] in [place]. Provided phone to schedule ride to [place] in [place] on Friday and pt informed that there is a free dental clinic in [facility] Saturday

## 2023-10-10 NOTE — Patient Instructions (Addendum)
 Go to [facility] at [address] [place] [address] [phone] for in initial intake Friday 3/ after Friday am appointment  Go to [facility] at [place] 10/13/23 Doors open at 6:00 am, first come first served.  [address] [place], [place]
